# Patient Record
Sex: Female | Born: 1980 | Race: White | Hispanic: No | Marital: Married | Smoking: Current every day smoker
Health system: Southern US, Community
[De-identification: ages and names within clinical notes are randomized; demographics above are authoritative.]

## PROBLEM LIST (undated history)

## (undated) DIAGNOSIS — F319 Bipolar disorder, unspecified: Secondary | ICD-10-CM

## (undated) DIAGNOSIS — F32A Depression, unspecified: Secondary | ICD-10-CM

## (undated) DIAGNOSIS — K219 Gastro-esophageal reflux disease without esophagitis: Secondary | ICD-10-CM

## (undated) DIAGNOSIS — I471 Supraventricular tachycardia, unspecified: Secondary | ICD-10-CM

## (undated) DIAGNOSIS — R002 Palpitations: Secondary | ICD-10-CM

## (undated) DIAGNOSIS — G43909 Migraine, unspecified, not intractable, without status migrainosus: Secondary | ICD-10-CM

## (undated) DIAGNOSIS — F329 Major depressive disorder, single episode, unspecified: Secondary | ICD-10-CM

## (undated) HISTORY — DX: Gastro-esophageal reflux disease without esophagitis: K21.9

## (undated) HISTORY — DX: Palpitations: R00.2

## (undated) HISTORY — PX: CHOLECYSTECTOMY: SHX55

## (undated) HISTORY — PX: ABDOMINAL HYSTERECTOMY: SHX81

## (undated) HISTORY — PX: TUBAL LIGATION: SHX77

---

## 2000-06-23 ENCOUNTER — Emergency Department (HOSPITAL_COMMUNITY): Admission: EM | Admit: 2000-06-23 | Discharge: 2000-06-24 | Payer: Self-pay | Admitting: Neurology

## 2001-09-16 ENCOUNTER — Inpatient Hospital Stay (HOSPITAL_COMMUNITY): Admission: EM | Admit: 2001-09-16 | Discharge: 2001-09-22 | Payer: Self-pay | Admitting: Psychiatry

## 2001-11-04 ENCOUNTER — Inpatient Hospital Stay (HOSPITAL_COMMUNITY): Admission: EM | Admit: 2001-11-04 | Discharge: 2001-11-12 | Payer: Self-pay | Admitting: Psychiatry

## 2004-10-10 ENCOUNTER — Ambulatory Visit: Payer: Self-pay | Admitting: Cardiology

## 2005-06-18 ENCOUNTER — Ambulatory Visit: Payer: Self-pay | Admitting: Cardiology

## 2005-12-24 ENCOUNTER — Ambulatory Visit: Payer: Self-pay | Admitting: Cardiology

## 2007-02-07 ENCOUNTER — Ambulatory Visit (HOSPITAL_COMMUNITY): Admission: RE | Admit: 2007-02-07 | Discharge: 2007-02-07 | Payer: Self-pay | Admitting: Obstetrics and Gynecology

## 2008-06-27 ENCOUNTER — Encounter: Payer: Self-pay | Admitting: Cardiology

## 2008-06-29 ENCOUNTER — Emergency Department (HOSPITAL_COMMUNITY): Admission: EM | Admit: 2008-06-29 | Discharge: 2008-06-29 | Payer: Self-pay | Admitting: Emergency Medicine

## 2008-06-30 ENCOUNTER — Ambulatory Visit (HOSPITAL_COMMUNITY): Admission: RE | Admit: 2008-06-30 | Discharge: 2008-06-30 | Payer: Self-pay | Admitting: Emergency Medicine

## 2008-07-05 ENCOUNTER — Ambulatory Visit: Payer: Self-pay | Admitting: Cardiology

## 2008-07-14 ENCOUNTER — Observation Stay (HOSPITAL_COMMUNITY): Admission: RE | Admit: 2008-07-14 | Discharge: 2008-07-15 | Payer: Self-pay | Admitting: General Surgery

## 2008-07-14 ENCOUNTER — Encounter (INDEPENDENT_AMBULATORY_CARE_PROVIDER_SITE_OTHER): Payer: Self-pay | Admitting: General Surgery

## 2008-12-31 IMAGING — US US OB DETAIL+14 WK
1 series · 14 of 28 positions shown · non-contrast
Comparison: none

OBSTETRICAL ULTRASOUND:
 This ultrasound was performed in The [HOSPITAL], and the [facility] OB/[facility] report will be stored to [REDACTED] PACS.

[Series 1: us ob detail+14 wk · 14 of 90 slices shown]
[im 4/90]
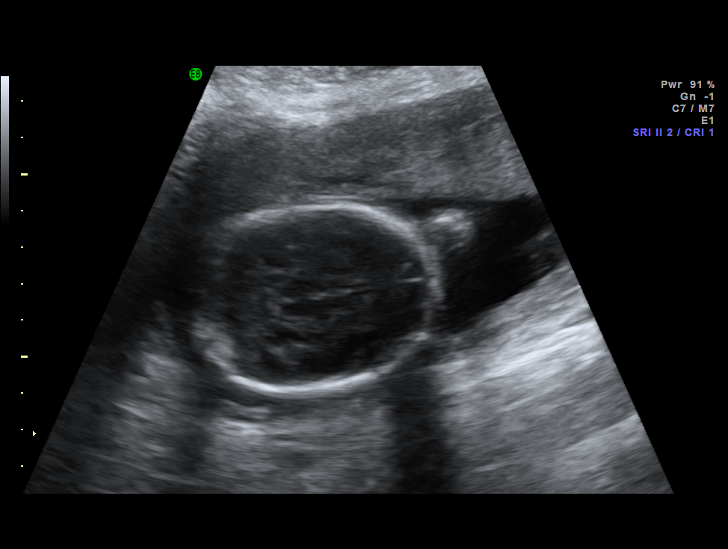
[im 10/90]
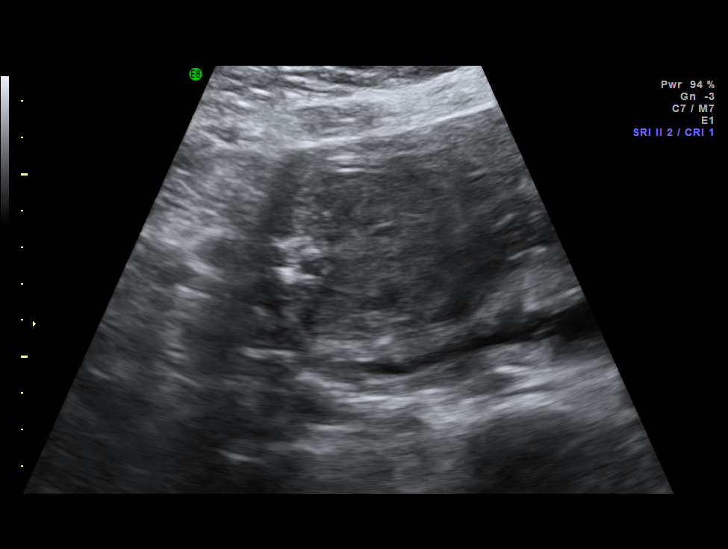
[im 17/90]
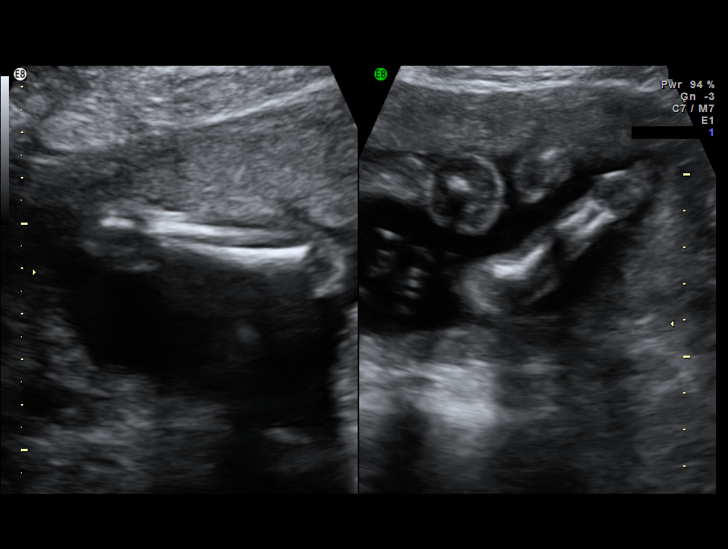
[im 24/90]
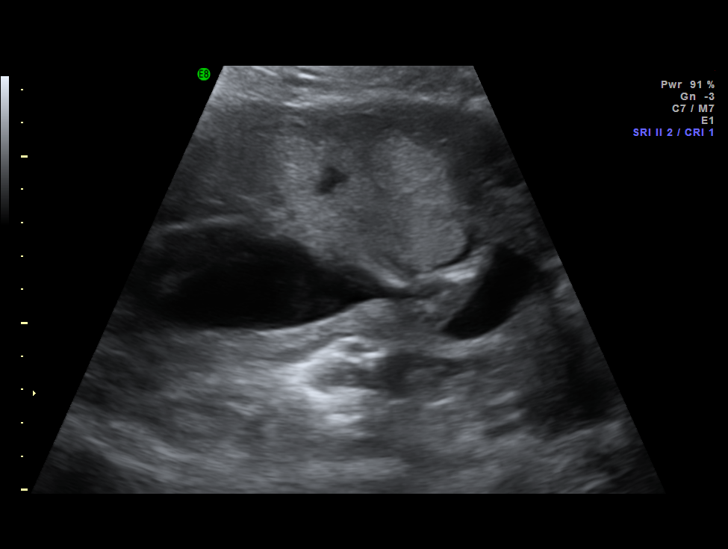
[im 30/90]
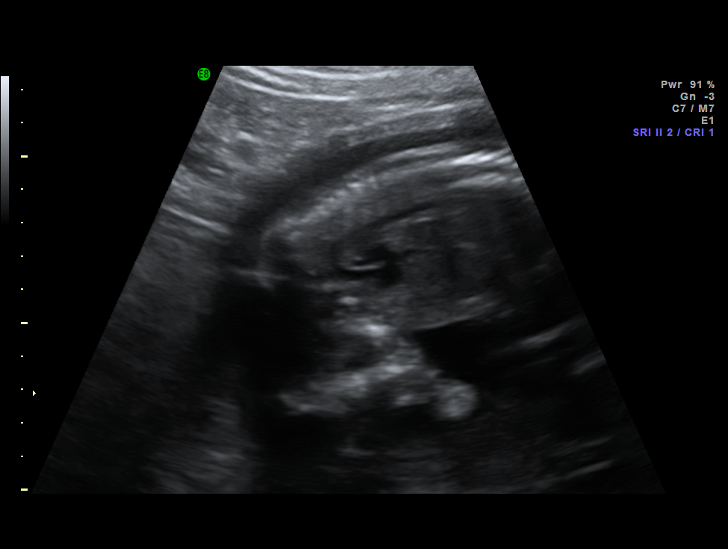
[im 37/90]
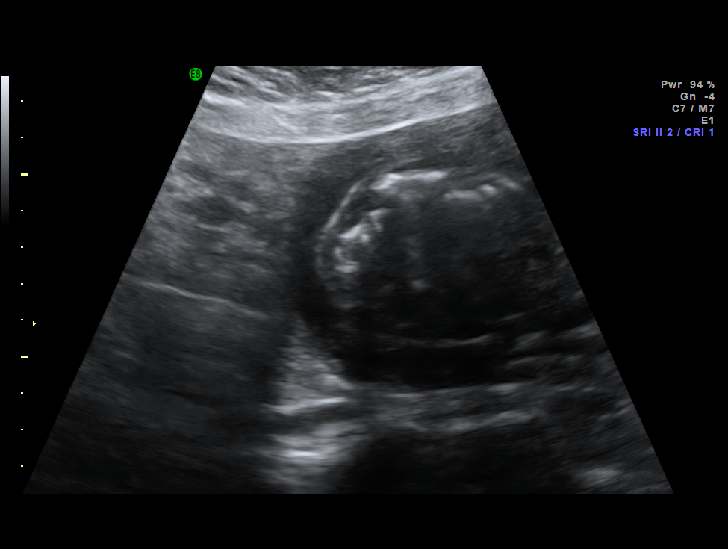
[im 43/90]
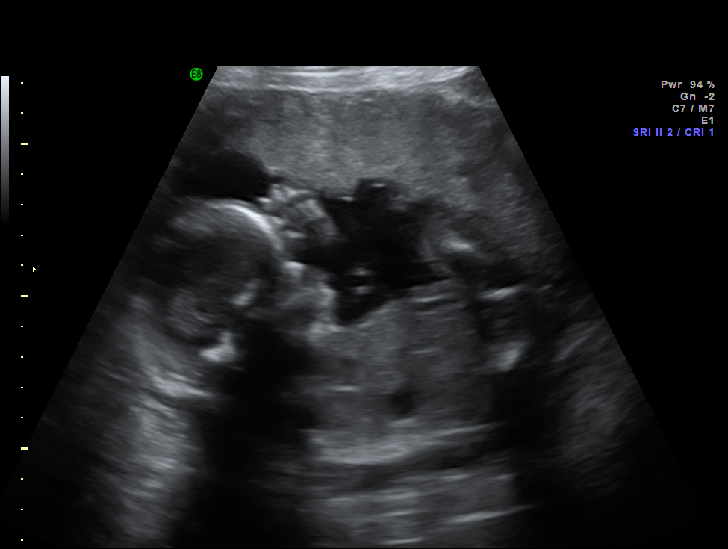
[im 50/90]
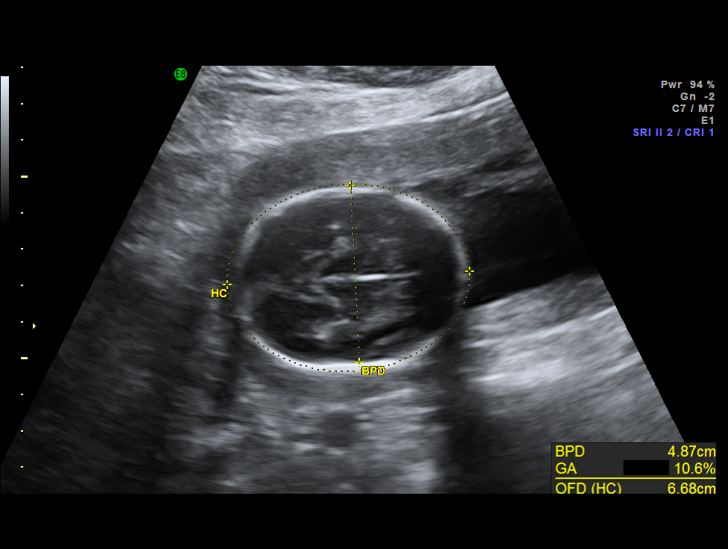
[im 57/90]
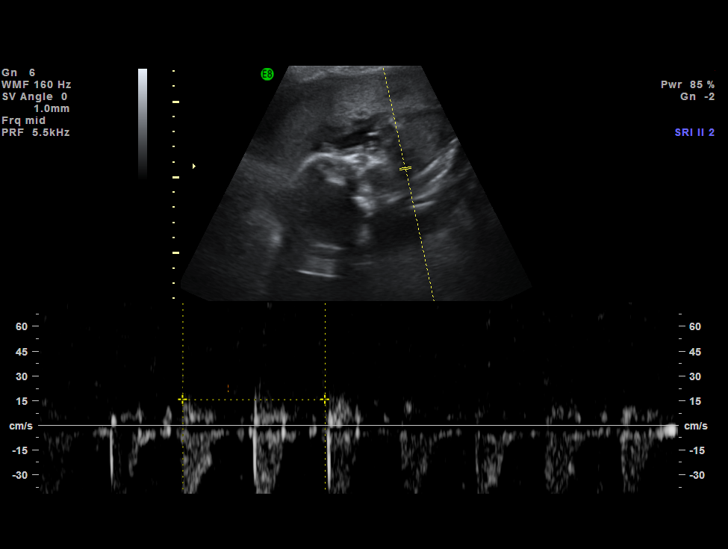
[im 63/90]
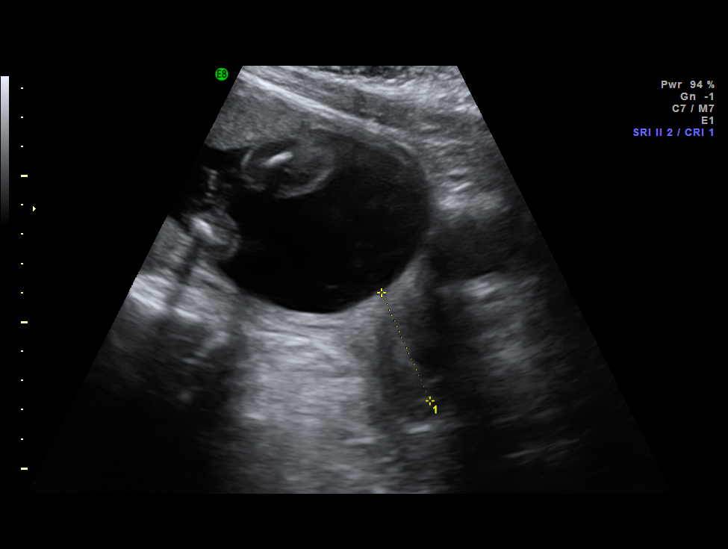
[im 70/90]
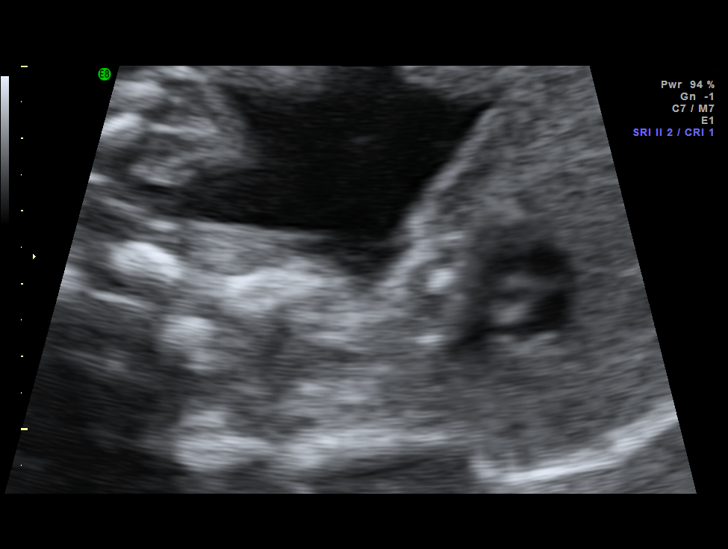
[im 76/90]
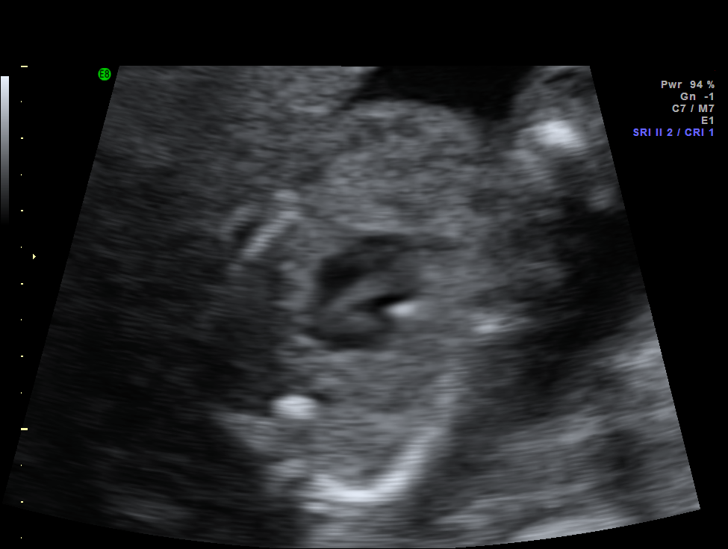
[im 83/90]
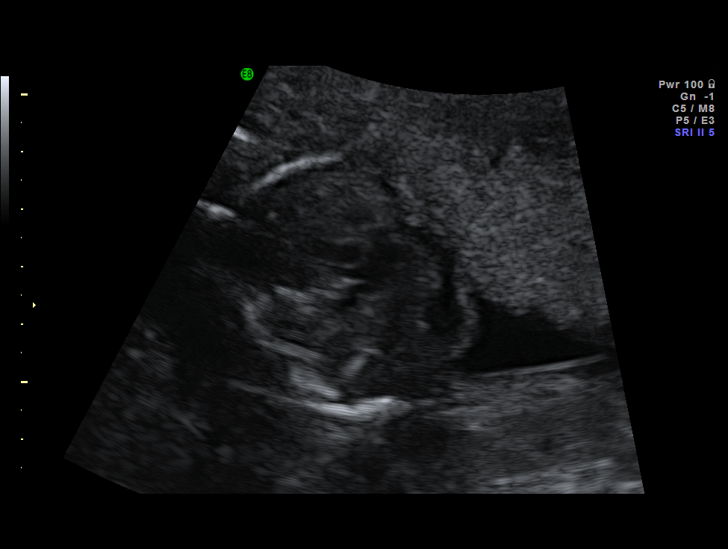
[im 90/90]
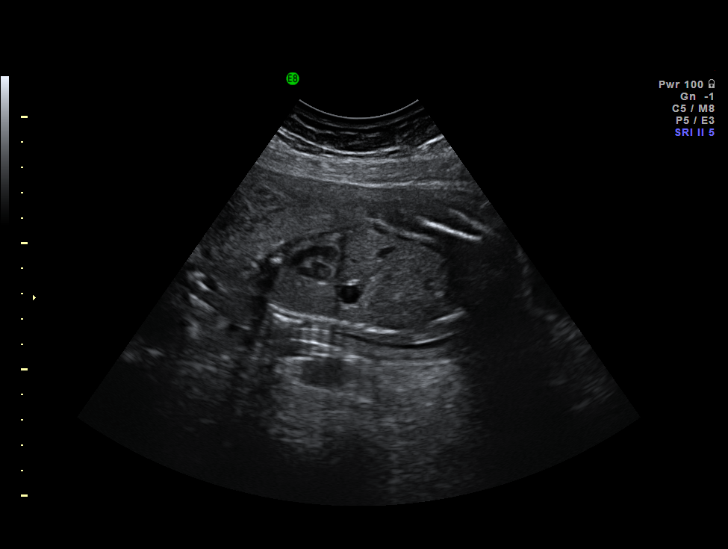

[14 of 28 positions shown; findings below may reference images not displayed]

IMPRESSION: The [facility] OB/GY[facility] report has also been faxed to the ordering physician.

## 2009-06-19 ENCOUNTER — Emergency Department (HOSPITAL_COMMUNITY): Admission: EM | Admit: 2009-06-19 | Discharge: 2009-06-20 | Payer: Self-pay | Admitting: Emergency Medicine

## 2009-08-23 ENCOUNTER — Encounter (INDEPENDENT_AMBULATORY_CARE_PROVIDER_SITE_OTHER): Payer: Self-pay | Admitting: *Deleted

## 2009-11-30 ENCOUNTER — Encounter (INDEPENDENT_AMBULATORY_CARE_PROVIDER_SITE_OTHER): Payer: Self-pay | Admitting: *Deleted

## 2010-03-28 NOTE — Letter (Signed)
Summary: Appointment - Reminder 2  [facility] [place]. [address] [place] [address]   Phone: [phone]  Fax: [phone]     November 30, 2009 MRN: [id number]   [facility] [address] [place], [place]  [address]   Dear Ms. Grattan,  Our records indicate that it is time to schedule a follow-up appointment.  Dr.   Dietrich Pates       recommended that you follow up with [place] in   06/2009         . It is very important that we reach you to schedule this appointment. We look forward to participating in your health care needs. Please contact us at the number listed above at your earliest convenience to schedule your appointment.  If you are unable to make an appointment at this time, give [place] a call so we can update our records.     Sincerely,   Glass blower/designer

## 2010-03-28 NOTE — Letter (Signed)
Summary: Appointment - Reminder 2  [facility] [place]. [address] [place] [address]   Phone: [phone]  Fax: [phone]     August 23, 2009 MRN: [id number]   [facility] [address] [place], [place]  [address]   Dear Gwendolyn [name],  Our records indicate that it is time to schedule a follow-up appointment.  Dr. Dietrich Pates         recommended that you follow up with [place] in  MAY 2011          . It is very important that we reach you to schedule this appointment. We look forward to participating in your health care needs. Please contact us at the number listed above at your earliest convenience to schedule your appointment.  If you are unable to make an appointment at this time, give [place] a call so we can update our records.     Sincerely,   Glass blower/designer

## 2010-05-07 ENCOUNTER — Emergency Department (HOSPITAL_COMMUNITY)
Admission: EM | Admit: 2010-05-07 | Discharge: 2010-05-07 | Disposition: A | Discharge status: Home / Self Care | Payer: Medicaid Other | Attending: Emergency Medicine | Admitting: Emergency Medicine

## 2010-05-07 DIAGNOSIS — K219 Gastro-esophageal reflux disease without esophagitis: Secondary | ICD-10-CM | POA: Insufficient documentation

## 2010-05-07 DIAGNOSIS — F319 Bipolar disorder, unspecified: Secondary | ICD-10-CM | POA: Insufficient documentation

## 2010-05-07 DIAGNOSIS — K5289 Other specified noninfective gastroenteritis and colitis: Secondary | ICD-10-CM | POA: Insufficient documentation

## 2010-05-07 LAB — DIFFERENTIAL
Basophils Absolute: 0 10*3/uL (ref 0.0–0.1)
Eosinophils Absolute: 0.1 10*3/uL (ref 0.0–0.7)
Lymphocytes Relative: 26 % (ref 12–46)
Lymphs Abs: 1.6 10*3/uL (ref 0.7–4.0)
Monocytes Relative: 9 % (ref 3–12)

## 2010-05-07 LAB — CBC
HCT: 31.7 % — ABNORMAL LOW (ref 36.0–46.0)
Platelets: 219 10*3/uL (ref 150–400)
RBC: 4.35 MIL/uL (ref 3.87–5.11)
WBC: 6.1 10*3/uL (ref 4.0–10.5)

## 2010-05-07 LAB — URINALYSIS, ROUTINE W REFLEX MICROSCOPIC
Hgb urine dipstick: NEGATIVE
Ketones, ur: NEGATIVE mg/dL
pH: 6.5 (ref 5.0–8.0)

## 2010-05-07 LAB — PREGNANCY, URINE: Preg Test, Ur: NEGATIVE

## 2010-05-07 LAB — BASIC METABOLIC PANEL
Chloride: 104 mEq/L (ref 96–112)
Creatinine, Ser: 0.71 mg/dL (ref 0.4–1.2)
GFR calc Af Amer: >60 mL/min (ref >60)
Glucose, Bld: 94 mg/dL (ref 70–99)

## 2010-05-16 LAB — URINALYSIS, ROUTINE W REFLEX MICROSCOPIC
Bilirubin Urine: NEGATIVE
Leukocytes, UA: NEGATIVE
Nitrite: NEGATIVE
Specific Gravity, Urine: 1.015 (ref 1.005–1.030)
Urobilinogen, UA: 0.2 mg/dL (ref 0.0–1.0)
pH: 6 (ref 5.0–8.0)

## 2010-05-16 LAB — POCT I-STAT, CHEM 8
Calcium, Ion: 1.15 mmol/L (ref 1.12–1.32)
Chloride: 102 mEq/L (ref 96–112)
HCT: 42 % (ref 36.0–46.0)
Hemoglobin: 14.3 g/dL (ref 12.0–15.0)
Potassium: 3.8 mEq/L (ref 3.5–5.1)

## 2010-05-16 LAB — URINE MICROSCOPIC-ADD ON

## 2010-05-24 IMAGING — US US ABDOMEN COMPLETE
1 series · 14 of 25 positions shown · non-contrast
Comparison: None

CLINICAL DATA: Right upper quadrant pain.

COMPLETE ABDOMINAL ULTRASOUND

[Series 1: unknown · 0.28mm/px · 14 of 69 slices shown]
[im 1/69]
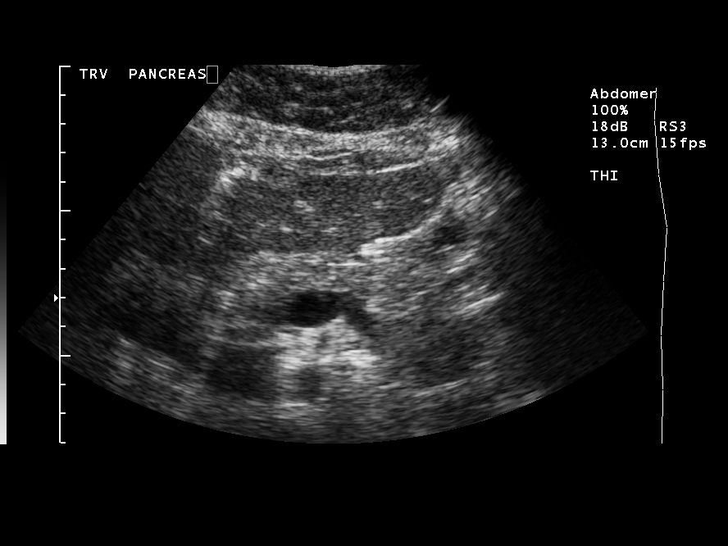
[im 6/69]
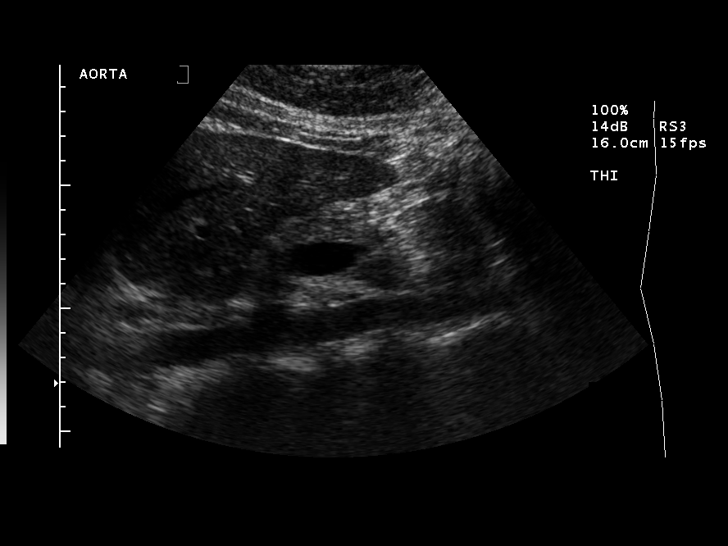
[im 12/69]
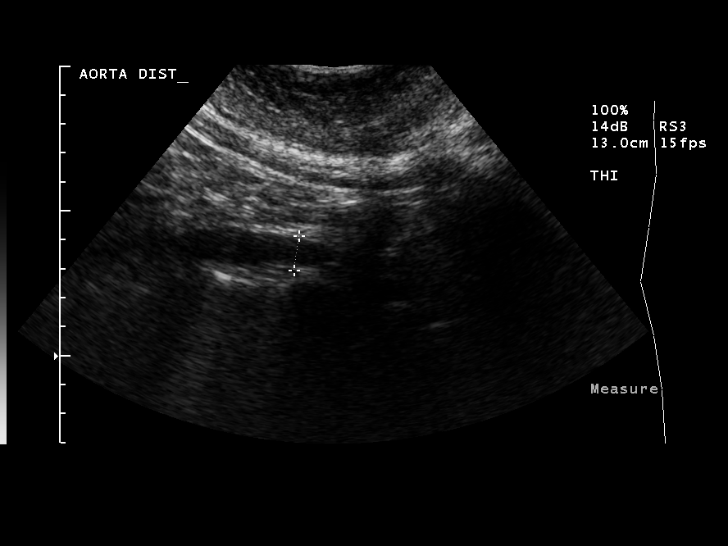
[im 18/69]
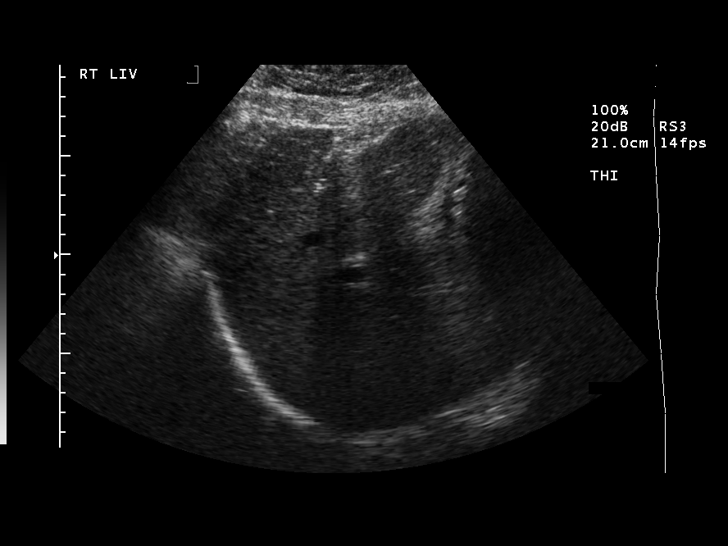
[im 23/69]
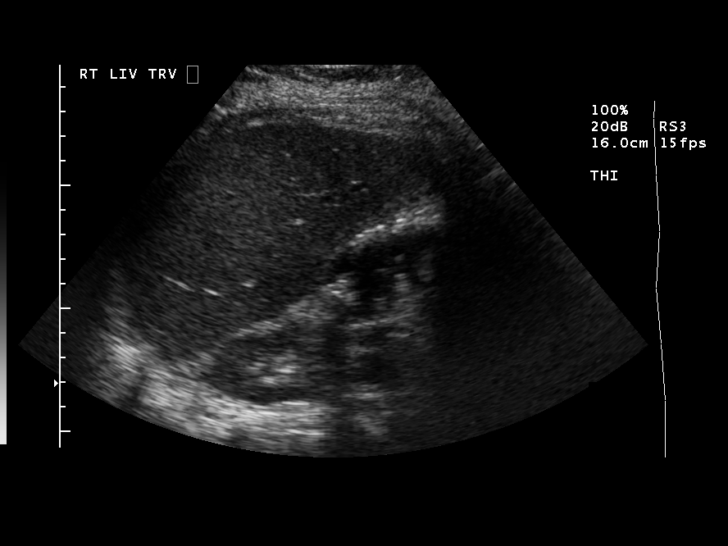
[im 26/69]
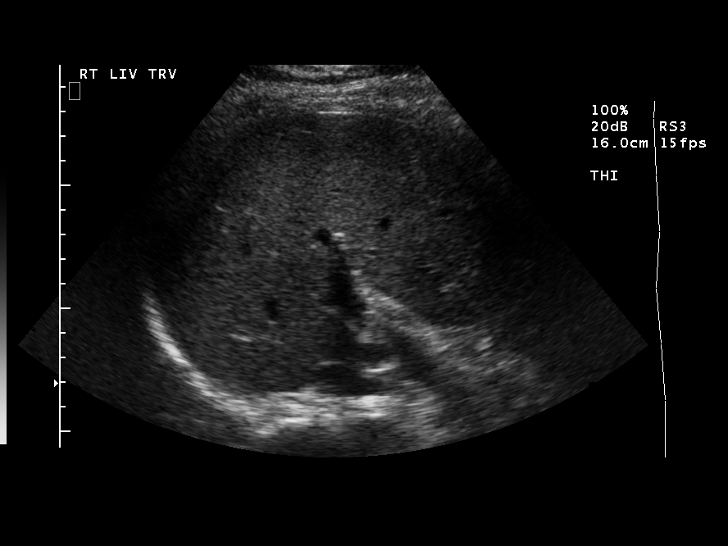
[im 32/69]
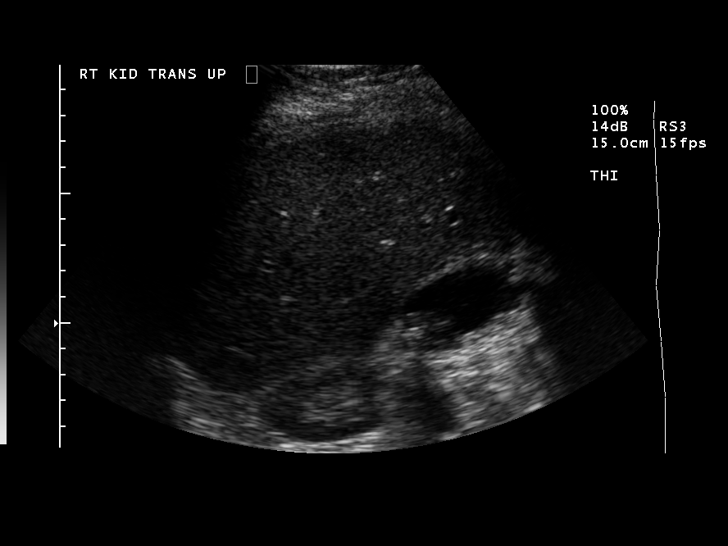
[im 37/69]
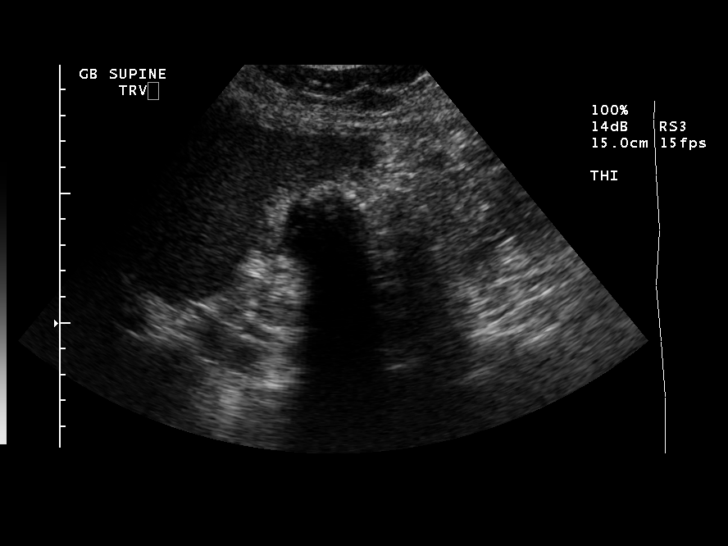
[im 43/69]
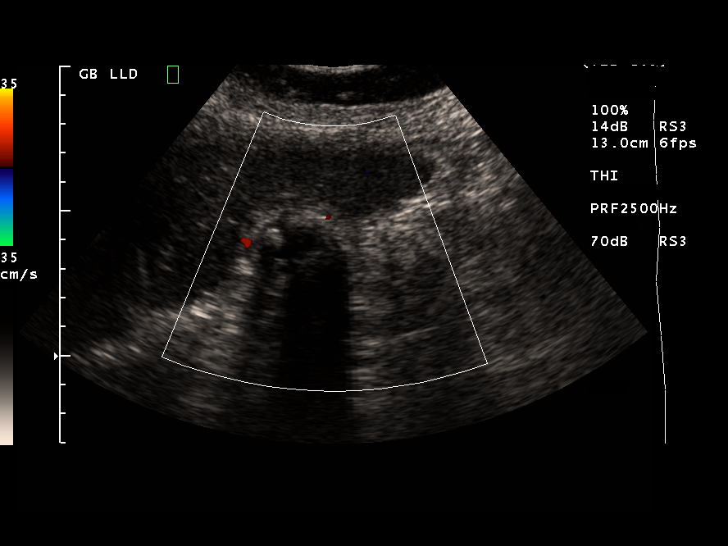
[im 46/69]
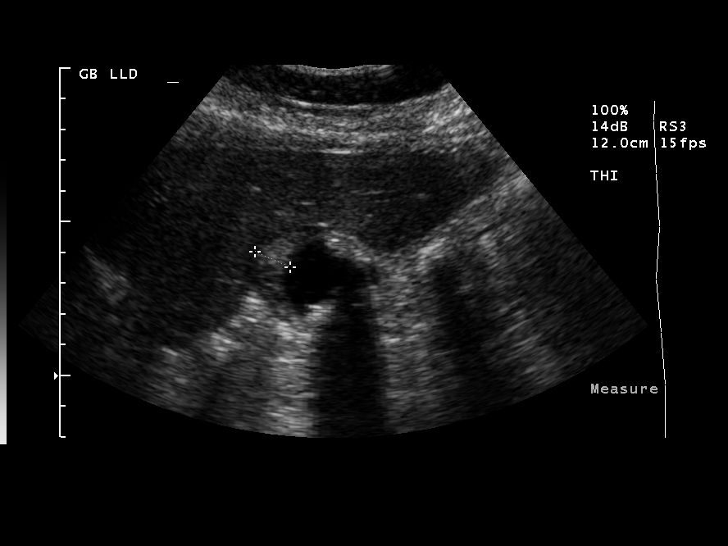
[im 52/69]
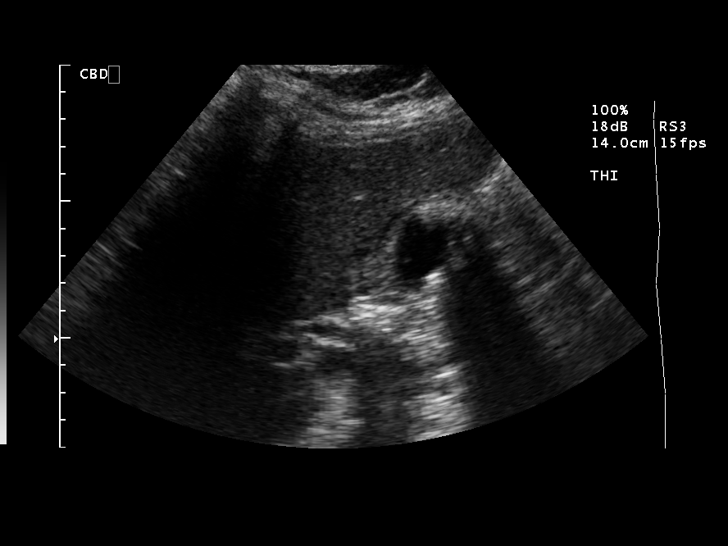
[im 57/69]
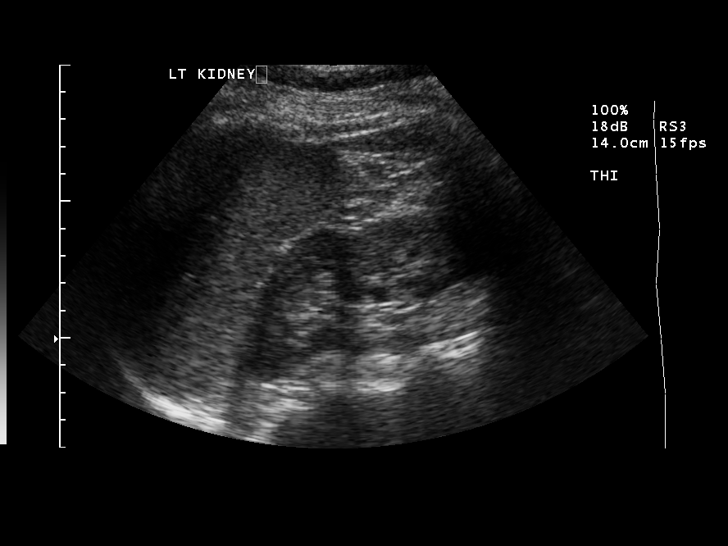
[im 63/69]
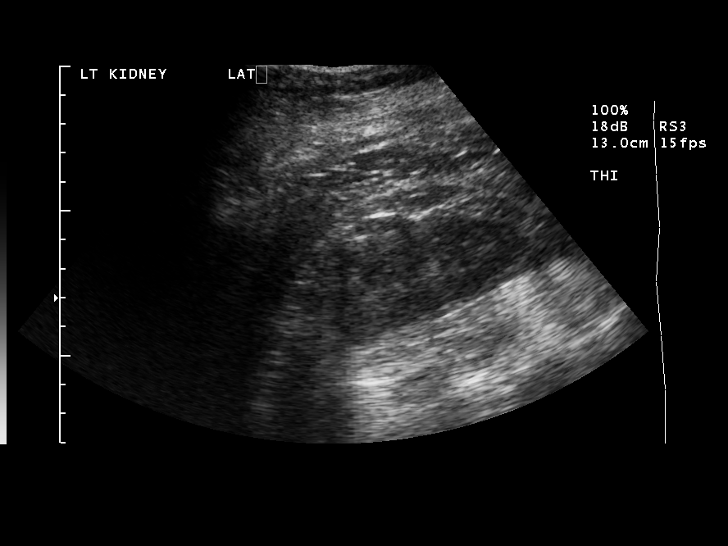
[im 69/69]
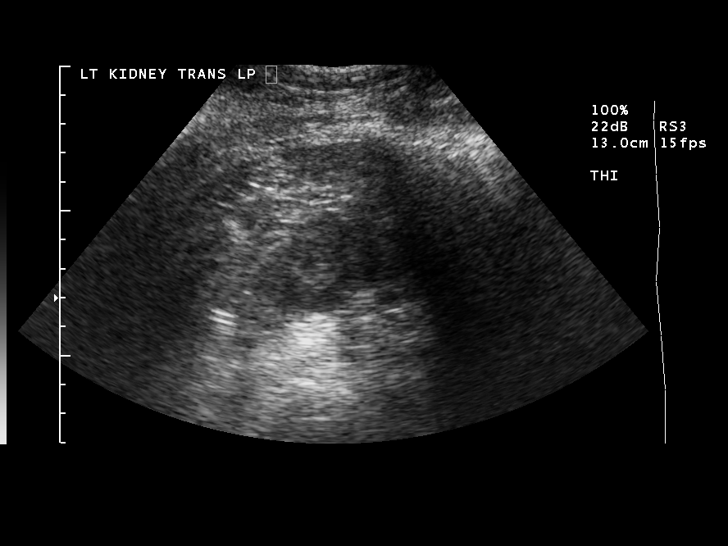

[14 of 25 positions shown; findings below may reference images not displayed]

FINDINGS: Gallbladder:  Multiple gallstones are identified in general
measuring under 12 mm in diameter.  Gallbladder wall thickening
noted, measuring up to 12 mm, with shadowing from the wall of the
fundus of the gallbladder favoring gas in the gallbladder wall over
adherent gallstones. Sonographic Murphy's sign is absent.

Common bile duct:  Up to 7 mm in diameter, abnormal.  No directly
visualized choledocholithiasis is identified.

Liver:  Unremarkable.

IVC:  Unremarkable.

Pancreas:  Unremarkable.

Spleen:  Unremarkable.

Right Kidney:  Unremarkable, 10.1 cm in length.

Left Kidney:  Unremarkable, 10.3 cm in length.

Abdominal aorta:  Unremarkable.

Other Findings:  None.
IMPRESSION: 1.  Gallstones with gallbladder wall thickening and shadowing from
the gallbladder wall suspicious for emphysematous cholecystitis.
2.  Dilated common bile duct, although no stone in the CBD is
directly visualized.

## 2010-06-06 LAB — COMPREHENSIVE METABOLIC PANEL
ALT: 147 U/L — ABNORMAL HIGH (ref 0–35)
AST: 69 U/L — ABNORMAL HIGH (ref 0–37)
Albumin: 3.4 g/dL — ABNORMAL LOW (ref 3.5–5.2)
Albumin: 3.7 g/dL (ref 3.5–5.2)
Alkaline Phosphatase: 95 U/L (ref 39–117)
BUN: 5 mg/dL — ABNORMAL LOW (ref 6–23)
BUN: 6 mg/dL (ref 6–23)
Calcium: 9.3 mg/dL (ref 8.4–10.5)
Creatinine, Ser: 0.86 mg/dL (ref 0.4–1.2)
Creatinine, Ser: 0.96 mg/dL (ref 0.4–1.2)
GFR calc Af Amer: >60 mL/min (ref >60)
Glucose, Bld: 109 mg/dL — ABNORMAL HIGH (ref 70–99)
Potassium: 3.7 mEq/L (ref 3.5–5.1)
Potassium: 3.7 mEq/L (ref 3.5–5.1)
Total Bilirubin: 1 mg/dL (ref 0.3–1.2)
Total Protein: 6.3 g/dL (ref 6.0–8.3)
Total Protein: 6.8 g/dL (ref 6.0–8.3)

## 2010-06-06 LAB — DIFFERENTIAL
Basophils Absolute: 0 10*3/uL (ref 0.0–0.1)
Basophils Absolute: 0 10*3/uL (ref 0.0–0.1)
Basophils Relative: 0 % (ref 0–1)
Basophils Relative: 1 % (ref 0–1)
Eosinophils Absolute: 0.1 10*3/uL (ref 0.0–0.7)
Eosinophils Relative: 0 % (ref 0–5)
Lymphocytes Relative: 32 % (ref 12–46)
Lymphocytes Relative: 15 % (ref 12–46)
Lymphs Abs: 1.1 10*3/uL (ref 0.7–4.0)
Lymphs Abs: 1.4 10*3/uL (ref 0.7–4.0)
Lymphs Abs: 1.4 10*3/uL (ref 0.7–4.0)
Monocytes Absolute: 0.2 10*3/uL (ref 0.1–1.0)
Monocytes Absolute: 0.4 10*3/uL (ref 0.1–1.0)
Monocytes Absolute: 0.4 10*3/uL (ref 0.1–1.0)
Monocytes Relative: 4 % (ref 3–12)
Monocytes Relative: 5 % (ref 3–12)
Neutro Abs: 2.7 10*3/uL (ref 1.7–7.7)
Neutro Abs: 7 10*3/uL (ref 1.7–7.7)
Neutrophils Relative %: 60 % (ref 43–77)
Neutrophils Relative %: 84 % — ABNORMAL HIGH (ref 43–77)
Neutrophils Relative %: 79 % — ABNORMAL HIGH (ref 43–77)

## 2010-06-06 LAB — HEPATIC FUNCTION PANEL
ALT: 259 U/L — ABNORMAL HIGH (ref 0–35)
Albumin: 3.3 g/dL — ABNORMAL LOW (ref 3.5–5.2)
Alkaline Phosphatase: 235 U/L — ABNORMAL HIGH (ref 39–117)
Alkaline Phosphatase: 363 U/L — ABNORMAL HIGH (ref 39–117)
Indirect Bilirubin: 0.7 mg/dL (ref 0.3–0.9)
Total Bilirubin: 1.1 mg/dL (ref 0.3–1.2)
Total Bilirubin: 1.1 mg/dL (ref 0.3–1.2)
Total Protein: 6.4 g/dL (ref 6.0–8.3)
Total Protein: 6.6 g/dL (ref 6.0–8.3)

## 2010-06-06 LAB — CBC
HCT: 34.3 % — ABNORMAL LOW (ref 36.0–46.0)
HCT: 34.7 % — ABNORMAL LOW (ref 36.0–46.0)
HCT: 38.2 % (ref 36.0–46.0)
Hemoglobin: 11.7 g/dL — ABNORMAL LOW (ref 12.0–15.0)
Hemoglobin: 11.9 g/dL — ABNORMAL LOW (ref 12.0–15.0)
Hemoglobin: 12.7 g/dL (ref 12.0–15.0)
MCHC: 33.2 g/dL (ref 30.0–36.0)
Platelets: 224 10*3/uL (ref 150–400)
Platelets: 227 10*3/uL (ref 150–400)
Platelets: 241 10*3/uL (ref 150–400)
RDW: 14.9 % (ref 11.5–15.5)

## 2010-06-06 LAB — BASIC METABOLIC PANEL
Calcium: 9.2 mg/dL (ref 8.4–10.5)
GFR calc non Af Amer: >60 mL/min (ref >60)
Potassium: 3.9 mEq/L (ref 3.5–5.1)
Sodium: 139 mEq/L (ref 135–145)

## 2010-06-06 LAB — AMYLASE: Amylase: 115 U/L (ref 27–131)

## 2010-06-06 LAB — HCG, QUANTITATIVE, PREGNANCY: hCG, Beta Chain, Quant, S: <2 m[IU]/mL (ref <5)

## 2010-06-06 LAB — RAPID URINE DRUG SCREEN, HOSP PERFORMED
Amphetamines: NOT DETECTED
Barbiturates: NOT DETECTED
Cocaine: NOT DETECTED
Tetrahydrocannabinol: POSITIVE — AB

## 2010-07-11 NOTE — Op Note (Signed)
NAMEEARLE, SIERZEGA NO.:  [id number]   MEDICAL RECORD NO.:  [id number]          PATIENT TYPE:  OIB   LOCATION:  A311                          FACILITY:  [facility]   PHYSICIAN:  [name], M.D. DATE OF BIRTH:  [birth year]   DATE OF PROCEDURE:  07/14/2008  DATE OF DISCHARGE:                               OPERATIVE REPORT   SURGEON:  [name], MD   PREOPERATIVE DIAGNOSES:  Cholecystitis, cholelithiasis   POSTOPERATIVE DIAGNOSES:  Cholecystitis, cholelithiasis   PROCEDURE:  Laparoscopic cholecystectomy.   SPECIMEN:  Gallbladder with stones.   WOUND CLASSIFICATION:  Contaminated.   NOTE:  This is a 30 year old white female who had 1 episode of right  upper quadrant pain with nausea and vomiting.  She was noted to have  multiple stones within her gallbladder on sonogram.  She was advised to  have a restrictive diet and planned for elective surgery.  We discussed  complications not limited to, but including bleeding, infection, damage  to bile ducts, perforation of organs and transitory diarrhea.  Informed  consent was obtained.  We also obtained Cardiology clearance as she had  had a history of PAT and SVT.   GROSS OPERATIVE FINDINGS:  The patient had an elongated thickened  gallbladder with literally 100 stones within it.  Small cystic duct  which was not cannulated for cholangiogram.  The right upper quadrant  otherwise appeared to be normal.   TECHNIQUE:  The patient was placed in a supine position and after the  adequate administration of general anesthesia via endotracheal  intubation, a Foley catheter was aseptically inserted.  Her abdomen was  prepped with Betadine solution and she was draped in the usual manner.  A periumbilical incision was carried out over the superior aspect of the  umbilicus.  The fascia was revealed and grasped with a sharp towel clip  and elevated, and a Veress needle was inserted and confirmed the  position with a  saline drop test.  The abdomen was then insufflated with  approximately 3.8 L of CO2.  We then placed the 11-mm cannula in the  umbilicus incision using the Visiport technique and then under direct  vision, an 11-mm cannula was placed in the epigastrium and two 5-mm  cannulas in the right upper quadrant laterally.  The gallbladder was  grasped, its adhesions were taken down.  The cystic duct was visualized  and clamped triply and divided as was the cystic artery.  The  gallbladder was then removed uneventfully from the liver bed using the  hook cautery device.  There was a spillage of couple stones which were  easily retrieved.  There was no spillage of any bile.  This was because  it was a short cystic duct and I wanted to make sure that it was well  clipped and there was less clipped near the gallbladder.  The  gallbladder was then retrieved with the Endosac device without any  problems and then removed through the epigastric incision.  We then  closed the epigastric incision with 0 Polysorb suture and used 0.5%  Sensorcaine at all  port sites for comfort.  Prior to closure, we  irrigated and checked for hemostasis.  I had elected to leave a Surgicel  within the liver bed and also a Jackson-Pratt drain to exit from one of  the lateral 5-mm cannula sites.  Prior to closure, all sponge, needle,  and instrument counts found to be correct.  Estimated blood loss was  minimal.  She received a liter of crystalloids intraoperatively.  The  drain was sutured in place with 3-0 nylon, and there were no  complications.   I will follow this patient perioperatively which she is to return to Dr.  Clelia Croft in [place] for any medical problems she will may have in the future.      [name], M.D.  Electronically Signed     WB/MEDQ  D:  07/14/2008  T:  07/15/2008  Job:  [id number]   cc:   [facility]   Dr. Clelia Croft

## 2010-07-11 NOTE — Letter (Signed)
Jul 05, 2008    Gwendolyn Barthel, MD  Gwendolyn Gwendolyn [name] [address]  [name], [place] [address]   RE:  Gwendolyn, Gwendolyn [name]  MRN:  [id number]  /  DOB:  [birth year]   Dear Gwendolyn Gwendolyn [name],   It was my pleasure evaluating Gwendolyn Gwendolyn [name] in consultation in the office  today at your request for a history of paroxysmal supraventricular  tachycardia.  As you know, Gwendolyn Gwendolyn [name] has summoned the EMS on a  number of occasions, but not within the past 2 years, for tachy  palpitations.  She reports that her heart rate was extremely high.  She  was given medication in the field with conversion to sinus rhythm.  The  most recent episode occurred in late 2008.  We have requested records,  which are pending.  Gwendolyn Gwendolyn [name] has not had chest pain, dyspnea,  lightheadedness, nor syncope with her SVT.  The possibility of  radiofrequency ablation was discussed with her, but was not thought to  be indicated due to the infrequency of her spells.  She previously was  treated with as much as 200 mg per day of long-acting metoprolol, but  Gwendolyn was cut to a total of 150 mg in divided doses due to  lightheadedness and the fatigue on a higher dose.   Cardiology consultation is obtained in anticipation of laparoscopic  cholecystectomy for an episode of abdominal pain requiring treatment in  the emergency department with associated cholelithiasis.   PAST MEDICAL HISTORY:  Notable for GERD, bipolar disorder, depression,  and genital herpes.  She has had migraine headaches in the past and been  diagnosed with narcolepsy.   SOCIAL HISTORY:  Two young children; previously smoked cigarettes, but  not for the past few years; no use of alcohol, but does use cannabis.  She has two children under the age of 2, which provide her, her daily  physical activity.  She has never previously undergone surgery.  She has  only been hospitalized for normal spontaneous vaginal deliveries.   ALLERGIES:  She reports an allergy to PENICILLIN and DOXYCYCLINE.   CURRENT  MEDICATIONS:  1. Prilosec 20 mg daily.  2. Metoprolol 100 mg q.a.m. and 50 mg q.p.m.  3. Diazepam 5 mg b.i.d.  4. Iron supplement.  5. Zoloft 100 mg daily.  6. Lamictal 100 mg daily.  7. Valtrex 500 mg daily.  8. Depo Provera.   FAMILY HISTORY:  Father is alive, but has a history of cardiac disease.  Mother is alive and well.  She has 2 brothers, both of whom have a  cardiac history.   REVIEW OF SYSTEMS:  Notable for intermittent headaches, the need for  corrective lenses, rare episodes of nausea and heartburn.  All other  systems reviewed and are negative.   PHYSICAL EXAMINATION:  GENERAL:  Pleasant overweight Gwendolyn [name] in no acute  distress.  VITAL SIGNS:  The weight is 203, blood pressure 110/70, heart rate 55  and regular, respirations 14 and unlabored.  HEENT:  EOMs full; normal oral mucosa; pierced tongue.  NECK:  No jugular venous distention; normal carotid upstrokes without  bruits.  ENDOCRINE:  No thyromegaly.  HEMATOPOIETIC:  No adenopathy.  LUNGS:  Clear.  SKIN:  No significant lesions.  CARDIAC:  Normal first and second heart sounds.  ABDOMEN:  Pierced umbilicus; soft and nontender; no organomegaly.  EXTREMITIES:  No edema; normal distal pulses.  NEUROLOGIC:  Symmetric strength and tone; normal cranial nerves.   EKG:  Normal sinus rhythm; shallow T-wave inversions in  lead V2 and V3  consistent with a persistent juvenile pattern.   Records and laboratories were reviewed from her treatment in the  emergency department.  I ultrasound, she had cholelithiasis and evidence  for cholecystitis.  Common bile duct was dilated, but no stone was  identified.  CBC was normal including a normal white count.  A chemistry  profile was normal except for borderline glucose of 109.  Lipase and  amylase were normal.   IMPRESSION:  Gwendolyn Gwendolyn [name] has very infrequent episodes of symptomatic  paroxysmal supraventricular tachycardia.  It may be that she could  reduce her dose of beta-blocker.   I will seek prior records before  making that recommendation.  For now, she will take metoprolol 100 mg,  diazepam 5 mg, and omeprazole 20 mg on the morning of surgery.  I will  be available to assist with her care as required.  I will plan to see  Gwendolyn Gwendolyn [name] again in the office in 1 year or should paroxysmal  supraventricular tachycardia recur.    Sincerely,      Gwendolyn Friends. Dietrich Pates, MD, [facility]  Electronically Signed    RMR/MedQ  DD: 07/05/2008  DT: 07/06/2008  Job #: [id number]   CC:   Kirstie Peri, MD  Jonelle Sidle, MD
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
## 2010-07-14 NOTE — Discharge Summary (Signed)
Gwendolyn [name], MKRTCHYAN NO.:  [id number]   MEDICAL RECORD NO.:  [id number]                  PATIENT TYPE:  IPS   LOCATION:  0502                                 FACILITY:  [facility]   PHYSICIAN:  Geoffery Lyons, MD                     DATE OF BIRTH:  March 28, 1980   DATE OF ADMISSION:  09/16/2001  DATE OF DISCHARGE:  09/22/2001                                 DISCHARGE SUMMARY   CHIEF COMPLAINT AND PRESENT ILLNESS:  This was the first admission to [facility] for this 30 year old white female voluntarily  admitted.  History of depression, crying, having suicidal thoughts, no  specific plan.  Financial concerns because of __________  husband who has  not worked for the past four years.  Sleep has been decreased, appetite has  been increased with a 20 pound weight gain.  Increased depression, feeling  very hopeless and helpless over the situation.  Positive visual  hallucinations.  No current suicidal or homicidal ideas, promised safety.   PAST PSYCHIATRIC HISTORY:  First time at [facility].  She goes  to [facility] on an outpatient basis.  One prior  suicide attempt when she cut her wrist at age 27.   SUBSTANCE ABUSE HISTORY:  Denied the use or abuse of any substances.   PAST MEDICAL HISTORY:  Paroxysmal atrial tachycardia.   MEDICATIONS:  1. Celexa 30 mg per day.  2. Valium 5 mg daily.  3. Toprol XL 25 mg daily.   PHYSICAL EXAMINATION:  Physical examination was performed, failed to show  any acute findings.   MENTAL STATUS EXAM:  Mental status exam revealed an alert, young,  overweight, cooperative female, casually dressed.  Speech was clear and  relevant.  Mood was depressed.  Affect was flat.  Thought processes were  coherent; no evidence of psychosis, no auditory and visual hallucinations,  no suicidal or homicidal ideas.  Cognitive: Cognition was well preserved.   ADMISSION  DIAGNOSES:   AXIS I:  Major depression, recurrent.   AXIS II:  No diagnosis.   AXIS III:  Paroxysmal atrial tachycardia.   AXIS IV:  Moderate.   AXIS V:  Global assessment of functioning upon admission 30, highest global  assessment of functioning in the last year 55.   LABORATORY DATA:  Other laboratory workup: CBC was within normal limits.  Blood chemistries were within normal limits.  Thyroid profile was within  normal limits.  Urine pregnancy was negative.  Drug screen was positive for  benzodiazepines.   HOSPITAL COURSE:  She was admitted and started in individual and group  psychotherapy.  She was kept on the Toprol.  Celexa was increased to 40.  She was kept on the Valium and the Ambien.  Due to depression, we increased  Celexa to 60 mg per day.  There was a  family session held with the husband  in which she was able to assert herself and tell her husband the need for  him to start finding a job.  The fact that she was able to confront the  husband be assertive made her feel better about herself.  On July 28, much  improved, no suicidal ideas, no homicidal ideas.  Upon discharge, she found  out that the husband had found a job and had decreased the amount of stress.  She was willing and motivated to pursue outpatient treatment; no suicidal or  homicidal ideation.   DISCHARGE DIAGNOSES:   AXIS I:  Major depression, recurrent.   AXIS II:  No diagnosis.   AXIS III:  Paroxysmal atrial tachycardia.   AXIS IV:  Moderate.   AXIS V:  Global assessment of functioning upon discharge 55.   DISCHARGE MEDICATIONS:  1. Toprol XL 25 mg daily.  2. Valium 5 mg at bedtime.  3. Celexa 60 mg per day.  4. Ambien 10 mg at bedtime for sleep.  5. Vistaril 25 mg one every six hours as needed for anxiety.   FOLLOW UP:  Follow-up at [facility].                                                 Geoffery Lyons, MD    IL/MEDQ  D:  10/22/2001  T:  10/23/2001   Job:  [id number]

## 2010-07-14 NOTE — Discharge Summary (Signed)
Gwendolyn [name], Gwendolyn [name] NO.:  [id number]   MEDICAL RECORD NO.:  [id number]                   PATIENT TYPE:  IPS   LOCATION:  0506                                 FACILITY:  [facility]   PHYSICIAN:  Carolanne Grumbling, M.D.                 DATE OF BIRTH:  1980/07/16   DATE OF ADMISSION:  11/04/2001  DATE OF DISCHARGE:  11/12/2001                                 DISCHARGE SUMMARY   INITIAL ASSESSMENT AND DIAGNOSIS:  The patient was admitted to the service  of Dr. Dub Mikes and was under his care until the last day of admission when I  happened to be on call.  She was admitted because of depression with  suicidal thoughts and she reportedly had some thoughts of hurting her  husband also.  She had no specific plans for either set of thoughts.  She  had felt depressed for the past 4 months.  She also reported some periods of  elation.  She had been recently discharged in July of this year.  She said  she coped for about 2 weeks and then got worse.  She reportedly was  experiencing auditory hallucinations telling her to hurt herself.  She also  admitted to a history of visual hallucinations.   MENTAL STATUS AT THE TIME OF THE INITIAL EVALUATION:  Was not recorded,  perhaps because of the recent admission.   PHYSICAL EXAMINATION:  Essentially within normal limits or noncontributory.   ADMITTING DIAGNOSIS:   AXIS I:  1. Major depressive disorder with suicidal ideation.  2. Rule out bipolar disorder.   AXIS II:  Deferred.   AXIS III:  1. Paroxysmal atrial tachycardia.  2. Supraventricular tachycardia.  3. Herpes.  4. Questionable generalized numbness.   AXIS IV:  Moderate.   AXIS V:  25/65.   FINDINGS:  All indicated laboratory examinations were within normal limits  or were noncontributory.   HOSPITAL COURSE:  While in the hospital, the patient basically complained of  headaches as her primary issue.  Her depression seemed to be aggravated by  situational  issues.  She and her husband had disagreements over taking care  of the children.  She reportedly at times would take them all and [place] which [place] her husband.  He also  was concerned because of her headaches and medication she was not properly  taking care of the kids.  She was concerned that he was overly disciplining  them.  She did have a session with her husband and her mother before she was  discharged.  At no point during the hospitalization did she complain of any  suicidal thoughts or threats towards anyone else, and consequently she was  discharged home.   MEDICATIONS AT THE TIME OF DISCHARGE:  1. Wellbutrin SR 200 mg daily.  2. Gabitril 4 mg tablets, taking 1/2 tablet per  day and 1 tablet at bedtime.  3. Toprol XL 50 mg daily.  4. Trazodone 100 mg tablets, taking 1/2 to 1 tablet at bedtime as needed.  5. Vistaril 50 mg tablets, taking 1/2 to 1 tablet every 6 hours as needed.  6. Midrin, taking 1 every 6 hours as needed for headaches and was prescribed     only 20 tablets.  7. Seroquel 20 mg tablets 4 times a day as needed.   POST HOSPITAL CARE PLANS:  She was to follow up with her family doctor for  pain medications, and she was cautioned regarding drowsiness because she is  taking trazodone, Vistaril, Midrin and Seroquel, all of which can cause  drowsiness.  She was to follow up at the [facility] with an appointment on November 18, 2001.   DIET AND ACTIVITY:  There were no restrictions placed on her diet or her  activity other than being cautious of the drowsiness.                                               Carolanne Grumbling, M.D.    GT/MEDQ  D:  11/24/2001  T:  11/24/2001  Job:  [id number]

## 2010-07-14 NOTE — Assessment & Plan Note (Signed)
[facility]                            [facility] [facility] OFFICE NOTE   NAME:Gwendolyn [name], Gwendolyn [name]                MRN:          [id number]  DATE:12/24/2005                            DOB:          1980/12/18    PRIMARY CARDIOLOGIST:  Jonelle Sidle, MD   REASON FOR OFFICE VISIT:  Scheduled six month follow-up.   Gwendolyn [name] is a 30 year old female, with a history of paroxysmal  supraventricular tachycardia, who now presents for scheduled follow-up.  When last seen here in the clinic in April, by Arnette Felts, [facility], she was  placed on an increased dose of Toprol to 200 mg q.d. However, she reports to  me today that this caused a drop in her pressure and thus necessitated a cut  back to the current 100 a.m./50 p.m. dose.  She feels much better in having  done so.   Regarding palpitations, she has had only about three episodes since last  seen in the clinic.  While these are of relatively brief duration, less than  a minute and not associated with any significant symptoms.  These are  essentially benign compared to what she has experienced in the past.   The patient quite smoking a month ago, refrains from all caffeinated  beverages but does report being under a bit of stress having to raise  three children.  Of note, she is on total disability.   CURRENT MEDICATIONS:  1. Toprol XL 100 every morning/50 every evening.  2. Wellbutrin XL 300 q.d.  3. Megace 40 mg q.d.  4. Buspar 15 every morning.   PHYSICAL EXAMINATION:  Blood pressure 114/76, pulse 64, regular.  Weight  198.  12-lead electrocardiogram today revealed normal sinus rhythm at 69  beats per minute with normal axis, nonspecific ST changes.  NECK:  Palpable carotid pulses without bruits.  LUNGS:  Clear to auscultation all fields.  HEART:  Regular rate and rhythm (S1, S2).  No significant murmurs.  ABDOMEN:  Protuberant nontender.  EXTREMITIES:  No pedal edema.   IMPRESSION:  1.  Paroxysmal supraventricular tachycardia.  Well-controlled on beta      blocker.  2. Normal left ventricular function.  Electrocardiogram 2000.  3. Bipolar disorder.  4. Obesity.  5. History of tobacco.   PLAN:  Continue current medication regimen.  Resume clinic follow-up in six  months with Jonelle Sidle, MD  Patient is to contact our office if she  has any increase in the frequency or severity of her tachy palpitations.     ______________________________  Rozell Searing, PA-C    ______________________________  Jonelle Sidle, MD   GS/MedQ  DD: 12/24/2005  DT: 12/25/2005  Job #: [id number]   cc:   Lia Hopping

## 2010-08-13 ENCOUNTER — Emergency Department (HOSPITAL_COMMUNITY)
Admission: EM | Admit: 2010-08-13 | Discharge: 2010-08-13 | Disposition: A | Discharge status: Home / Self Care | Payer: Medicaid Other | Attending: Emergency Medicine | Admitting: Emergency Medicine

## 2010-08-13 DIAGNOSIS — B009 Herpesviral infection, unspecified: Secondary | ICD-10-CM | POA: Insufficient documentation

## 2011-02-07 ENCOUNTER — Encounter: Payer: Self-pay | Admitting: Cardiology

## 2011-12-10 ENCOUNTER — Emergency Department (HOSPITAL_COMMUNITY)
Admission: EM | Admit: 2011-12-10 | Discharge: 2011-12-11 | Disposition: A | Discharge status: Home / Self Care | Payer: Medicaid Other | Attending: Emergency Medicine | Admitting: Emergency Medicine

## 2011-12-10 ENCOUNTER — Encounter (HOSPITAL_COMMUNITY): Payer: Self-pay

## 2011-12-10 DIAGNOSIS — N949 Unspecified condition associated with female genital organs and menstrual cycle: Secondary | ICD-10-CM | POA: Insufficient documentation

## 2011-12-10 DIAGNOSIS — F17200 Nicotine dependence, unspecified, uncomplicated: Secondary | ICD-10-CM | POA: Insufficient documentation

## 2011-12-10 DIAGNOSIS — G8929 Other chronic pain: Secondary | ICD-10-CM | POA: Insufficient documentation

## 2011-12-10 DIAGNOSIS — R102 Pelvic and perineal pain: Secondary | ICD-10-CM

## 2011-12-10 DIAGNOSIS — N898 Other specified noninflammatory disorders of vagina: Secondary | ICD-10-CM | POA: Insufficient documentation

## 2011-12-10 DIAGNOSIS — R109 Unspecified abdominal pain: Secondary | ICD-10-CM | POA: Insufficient documentation

## 2011-12-10 DIAGNOSIS — R197 Diarrhea, unspecified: Secondary | ICD-10-CM | POA: Insufficient documentation

## 2011-12-10 DIAGNOSIS — N946 Dysmenorrhea, unspecified: Secondary | ICD-10-CM | POA: Insufficient documentation

## 2011-12-10 DIAGNOSIS — F319 Bipolar disorder, unspecified: Secondary | ICD-10-CM | POA: Insufficient documentation

## 2011-12-10 DIAGNOSIS — K219 Gastro-esophageal reflux disease without esophagitis: Secondary | ICD-10-CM | POA: Insufficient documentation

## 2011-12-10 HISTORY — DX: Depression, unspecified: F32.A

## 2011-12-10 HISTORY — DX: Migraine, unspecified, not intractable, without status migrainosus: G43.909

## 2011-12-10 HISTORY — DX: Major depressive disorder, single episode, unspecified: F32.9

## 2011-12-10 HISTORY — DX: Supraventricular tachycardia, unspecified: I47.10

## 2011-12-10 HISTORY — DX: Supraventricular tachycardia: I47.1

## 2011-12-10 HISTORY — DX: Bipolar disorder, unspecified: F31.9

## 2011-12-10 LAB — COMPREHENSIVE METABOLIC PANEL
ALT: 7 U/L (ref 0–35)
Alkaline Phosphatase: 64 U/L (ref 39–117)
BUN: 10 mg/dL (ref 6–23)
CO2: 25 mEq/L (ref 19–32)
Calcium: 9.6 mg/dL (ref 8.4–10.5)
GFR calc Af Amer: >90 mL/min (ref >90)
GFR calc non Af Amer: >90 mL/min (ref >90)
Glucose, Bld: 96 mg/dL (ref 70–99)
Potassium: 3.5 mEq/L (ref 3.5–5.1)
Sodium: 137 mEq/L (ref 135–145)
Total Protein: 6.7 g/dL (ref 6.0–8.3)

## 2011-12-10 LAB — CBC WITH DIFFERENTIAL/PLATELET
Eosinophils Absolute: 0.1 10*3/uL (ref 0.0–0.7)
Eosinophils Relative: 2 % (ref 0–5)
HCT: 35.1 % — ABNORMAL LOW (ref 36.0–46.0)
Hemoglobin: 11.8 g/dL — ABNORMAL LOW (ref 12.0–15.0)
Lymphocytes Relative: 27 % (ref 12–46)
Lymphs Abs: 1.7 10*3/uL (ref 0.7–4.0)
MCH: 26.7 pg (ref 26.0–34.0)
MCV: 79.4 fL (ref 78.0–100.0)
Monocytes Relative: 7 % (ref 3–12)
Platelets: 244 10*3/uL (ref 150–400)
RBC: 4.42 MIL/uL (ref 3.87–5.11)
WBC: 6.3 10*3/uL (ref 4.0–10.5)

## 2011-12-10 LAB — WET PREP, GENITAL
Clue Cells Wet Prep HPF POC: NONE SEEN
Trich, Wet Prep: NONE SEEN
Yeast Wet Prep HPF POC: NONE SEEN

## 2011-12-10 LAB — HCG, QUANTITATIVE, PREGNANCY: hCG, Beta Chain, Quant, S: <1 m[IU]/mL (ref <5)

## 2011-12-10 LAB — URINALYSIS, ROUTINE W REFLEX MICROSCOPIC
Glucose, UA: NEGATIVE mg/dL
Ketones, ur: NEGATIVE mg/dL
Protein, ur: NEGATIVE mg/dL
Urobilinogen, UA: 0.2 mg/dL (ref 0.0–1.0)

## 2011-12-10 LAB — URINE MICROSCOPIC-ADD ON

## 2011-12-10 MED ORDER — IBUPROFEN 800 MG PO TABS
800.0000 mg | ORAL_TABLET | Freq: Once | ORAL | Status: AC
  Administered 2011-12-10: 800 mg via ORAL
  Filled 2011-12-10: qty 1

## 2011-12-10 MED ORDER — HYDROCODONE-ACETAMINOPHEN 5-325 MG PO TABS
1.0000 | ORAL_TABLET | Freq: Once | ORAL | Status: AC
  Administered 2011-12-10: 1 via ORAL
  Filled 2011-12-10: qty 1

## 2011-12-10 NOTE — ED Notes (Signed)
Asher Muir RN and Wallis Bamberg NT in room to obtain urine sample via in and out cath

## 2011-12-10 NOTE — ED Notes (Signed)
Pt states low abd/pelvic pain x1 day, also notes she is on her menstrual cycle and states she has seen what looks like "a piece of tissue" in her vaginal discharge. Also notes mucus like discharge for 1 month that has gotten worse.

## 2011-12-10 NOTE — ED Notes (Signed)
Al Decant PA in room doing pelvic exam accompanied by NT

## 2011-12-10 NOTE — ED Provider Notes (Signed)
History     CSN: [id number]  Arrival date & time 12/10/11  2022   First MD Initiated Contact with Patient 12/10/11 2205      Chief Complaint  Patient presents with  . Abdominal Pain  . Vaginal Pain    (Consider location/radiation/quality/duration/timing/severity/associated sxs/prior treatment) HPI Comments: Started menses yest.  Has had pelvic pain since onset.  Passed a "piece of tissue" this PM.  "i have this gooey stuff coming out of my vagina".  Denies fever or chills.  No UTI sxs.  Has known herpes but denies any new sxs.  One sexual partner who reportedly has no sxs.  Patient is a 31 y.o. female presenting with abdominal pain and vaginal pain. The history is provided by the patient. No language interpreter was used.  Abdominal Pain The primary symptoms of the illness include abdominal pain, diarrhea, vaginal discharge and vaginal bleeding. The primary symptoms of the illness do not include fever, nausea, vomiting or dysuria. Episode onset: sees dr. Garner Nash in [place] for "over 100 lb weight loss in the past year".  chronic abd pain and diarrhea.  The vaginal discharge is not associated with dysuria.  The patient states that she believes she is currently not pregnant. The patient has not had a change in bowel habit. Symptoms associated with the illness do not include chills, urgency, hematuria or frequency.  Vaginal Pain Associated symptoms include abdominal pain. Pertinent negatives include no chills, fever, nausea or vomiting.    Past Medical History  Diagnosis Date  . PAT (paroxysmal atrial tachycardia)   . SVT (supraventricular tachycardia)   . Bipolar disorder   . Depression   . Migraines   . Acid reflux     Past Surgical History  Procedure Date  . Cholecystectomy   . Tubal ligation     Family History  Problem Relation Age of Onset  . Heart disease Father   . Heart disease Other     History  Substance Use Topics  . Smoking status: Current Some Day Smoker --  1.0 packs/day    Types: Cigarettes  . Smokeless tobacco: Not on file  . Alcohol Use: Yes     occasionally    OB History    Grav Para Term Preterm Abortions TAB SAB Ect Mult Living                  Review of Systems  Constitutional: Negative for fever and chills.  Gastrointestinal: Positive for abdominal pain and diarrhea. Negative for nausea and vomiting.  Genitourinary: Positive for vaginal bleeding, vaginal discharge, vaginal pain and pelvic pain. Negative for dysuria, urgency, frequency and hematuria.  All other systems reviewed and are negative.    Allergies  Amoxicillin  Home Medications   Current Outpatient Rx  Name Route Sig Dispense Refill  . ALBUTEROL SULFATE HFA 108 (90 BASE) MCG/ACT IN AERS Inhalation Inhale 2 puffs into the lungs every 6 (six) hours as needed. For shortness of breath    . DIAZEPAM 10 MG PO TABS Oral Take 10 mg by mouth 3 (three) times daily.    Marland Kitchen DILTIAZEM HCL ER 240 MG PO CP24 Oral Take 240 mg by mouth daily.    Marland Kitchen DIVALPROEX SODIUM 500 MG PO TBEC Oral Take 1,500 mg by mouth at bedtime. For bi-polar    . OMEPRAZOLE 20 MG PO CPDR Oral Take 20 mg by mouth daily.      . SERTRALINE HCL 100 MG PO TABS Oral Take 100 mg by mouth 2 (  two) times daily.     Marland Kitchen VALACYCLOVIR HCL 500 MG PO TABS Oral Take 500 mg by mouth daily.        BP 102/56  Pulse 68  Temp 99.3 F (37.4 C) (Oral)  Resp 20  Ht 5' (1.524 m)  Wt 128 lb (58.06 kg)  BMI 25.00 kg/m2  SpO2 100%  LMP 12/02/2011  Physical Exam  Nursing note and vitals reviewed. Constitutional: She is oriented to person, place, and time. She appears well-developed and well-nourished. No distress.  HENT:  Head: Normocephalic and atraumatic.  Eyes: EOM are normal.  Neck: Normal range of motion.  Cardiovascular: Normal rate, regular rhythm and normal heart sounds.   Pulmonary/Chest: Effort normal and breath sounds normal.  Abdominal: Soft. She exhibits no distension. There is no tenderness.    Musculoskeletal: Normal range of motion.  Neurological: She is alert and oriented to person, place, and time.  Skin: Skin is warm and dry.  Psychiatric: She has a normal mood and affect. Judgment normal.    ED Course  Procedures (including critical care time)  Labs Reviewed  CBC WITH DIFFERENTIAL - Abnormal; Notable for the following:    Hemoglobin 11.8 (*)     HCT 35.1 (*)     RDW 17.0 (*)     All other components within normal limits  COMPREHENSIVE METABOLIC PANEL - Abnormal; Notable for the following:    Total Bilirubin 0.2 (*)     All other components within normal limits  URINALYSIS, ROUTINE W REFLEX MICROSCOPIC - Abnormal; Notable for the following:    Specific Gravity, Urine >1.030 (*)     Hgb urine dipstick TRACE (*)     Bilirubin Urine SMALL (*)     All other components within normal limits  WET PREP, GENITAL - Abnormal; Notable for the following:    WBC, Wet Prep HPF POC FEW (*)     All other components within normal limits  URINE MICROSCOPIC-ADD ON - Abnormal; Notable for the following:    Squamous Epithelial / LPF MANY (*)     Bacteria, UA MANY (*)     All other components within normal limits  HCG, QUANTITATIVE, PREGNANCY  PREGNANCY, URINE  GC/CHLAMYDIA PROBE AMP, GENITAL  RPR   No results found.   1. Pain, pelvic, female   2. Menses painful       MDM  Wet prep- WNL GC, chlamydia and RPR pending. F/u with your PCP or GYN        Evalina Field, PA 12/11/11 0017  Evalina Field, PA 12/11/11 [id number]

## 2011-12-10 NOTE — ED Notes (Signed)
I am having abdominal pain and vaginal pain that is bad as having a baby per pt. I am currently on my period and I had some tissue come out before I came here per pt.

## 2011-12-11 MED ORDER — HYDROCODONE-ACETAMINOPHEN 5-325 MG PO TABS: 1.0000 | ORAL_TABLET | Freq: Four times a day (QID) | ORAL | Status: AC | PRN

## 2011-12-11 NOTE — ED Provider Notes (Signed)
Medical screening examination/treatment/procedure(s) were performed by non-physician practitioner and as supervising physician I was immediately available for consultation/collaboration.   Yaeli Hartung L Jakyah Bradby, MD 12/11/11 1627 

## 2012-01-05 ENCOUNTER — Emergency Department (HOSPITAL_COMMUNITY)
Admission: EM | Admit: 2012-01-05 | Discharge: 2012-01-05 | Disposition: A | Discharge status: Home / Self Care | Payer: Medicaid Other | Attending: Emergency Medicine | Admitting: Emergency Medicine

## 2012-01-05 ENCOUNTER — Encounter (HOSPITAL_COMMUNITY): Payer: Self-pay

## 2012-01-05 ENCOUNTER — Emergency Department (HOSPITAL_COMMUNITY): Payer: Medicaid Other

## 2012-01-05 DIAGNOSIS — B001 Herpesviral vesicular dermatitis: Secondary | ICD-10-CM

## 2012-01-05 DIAGNOSIS — R0602 Shortness of breath: Secondary | ICD-10-CM | POA: Insufficient documentation

## 2012-01-05 DIAGNOSIS — F329 Major depressive disorder, single episode, unspecified: Secondary | ICD-10-CM | POA: Insufficient documentation

## 2012-01-05 DIAGNOSIS — F319 Bipolar disorder, unspecified: Secondary | ICD-10-CM | POA: Insufficient documentation

## 2012-01-05 DIAGNOSIS — F3289 Other specified depressive episodes: Secondary | ICD-10-CM | POA: Insufficient documentation

## 2012-01-05 DIAGNOSIS — Z79899 Other long term (current) drug therapy: Secondary | ICD-10-CM | POA: Insufficient documentation

## 2012-01-05 DIAGNOSIS — K219 Gastro-esophageal reflux disease without esophagitis: Secondary | ICD-10-CM | POA: Insufficient documentation

## 2012-01-05 DIAGNOSIS — A6000 Herpesviral infection of urogenital system, unspecified: Secondary | ICD-10-CM | POA: Insufficient documentation

## 2012-01-05 DIAGNOSIS — F17200 Nicotine dependence, unspecified, uncomplicated: Secondary | ICD-10-CM | POA: Insufficient documentation

## 2012-01-05 MED ORDER — OXYCODONE-ACETAMINOPHEN 5-325 MG PO TABS
2.0000 | ORAL_TABLET | Freq: Once | ORAL | Status: AC
  Administered 2012-01-05: 2 via ORAL
  Filled 2012-01-05: qty 2

## 2012-01-05 MED ORDER — VALACYCLOVIR HCL 1 G PO TABS: 500.0000 mg | ORAL_TABLET | Freq: Three times a day (TID) | ORAL | Status: AC

## 2012-01-05 MED ORDER — VALACYCLOVIR HCL 500 MG PO TABS
1000.0000 mg | ORAL_TABLET | Freq: Once | ORAL | Status: AC
  Administered 2012-01-05: 1000 mg via ORAL
  Filled 2012-01-05: qty 2

## 2012-01-05 MED ORDER — HYDROCODONE-ACETAMINOPHEN 7.5-500 MG/15ML PO SOLN: 15.0000 mL | Freq: Four times a day (QID) | ORAL | Status: DC | PRN

## 2012-01-05 MED ORDER — LORAZEPAM 1 MG PO TABS
1.0000 mg | ORAL_TABLET | Freq: Once | ORAL | Status: AC
Start: 2012-01-05 — End: 2012-01-05
  Administered 2012-01-05: 1 mg via ORAL
  Filled 2012-01-05: qty 1

## 2012-01-05 NOTE — ED Notes (Signed)
Pain 7/10 perineum,  0/10 chest

## 2012-01-05 NOTE — ED Notes (Signed)
Pt was crying "alot" from genital Herpes pain when she started feeling left sided chest pain. Denies rapid HR. Now 3/10 pain.

## 2012-01-05 NOTE — ED Provider Notes (Signed)
History     CSN: [id number]  Arrival date & time 01/05/12  0348   None     No chief complaint on file. SCC: CP  (Consider location/radiation/quality/duration/timing/severity/associated sxs/prior treatment) HPI Hx per PT. " I think I am having a panic attack" CP and SOB that she attributes to pain from a herpes outbreak. She has not had an outbreak in years. Has vaginal blisters. Burning in quality, onset 2 days ago worse tonight. Mod in severity. No F/C. Pain constant, not radiating. No known alleviating fators Past Medical History  Diagnosis Date  . PAT (paroxysmal atrial tachycardia)   . SVT (supraventricular tachycardia)   . Bipolar disorder   . Depression   . Migraines   . Acid reflux     Past Surgical History  Procedure Date  . Cholecystectomy   . Tubal ligation     Family History  Problem Relation Age of Onset  . Heart disease Father   . Heart disease Other     History  Substance Use Topics  . Smoking status: Current Some Day Smoker -- 1.0 packs/day    Types: Cigarettes  . Smokeless tobacco: Not on file  . Alcohol Use: Yes     Comment: occasionally    OB History    Grav Para Term Preterm Abortions TAB SAB Ect Mult Living                  Review of Systems  Constitutional: Negative for fever and chills.  HENT: Negative for neck pain and neck stiffness.   Eyes: Negative for pain.  Respiratory: Positive for shortness of breath.   Cardiovascular: Positive for chest pain.  Gastrointestinal: Negative for abdominal pain.  Genitourinary: Positive for vaginal pain. Negative for dysuria.  Musculoskeletal: Negative for back pain.  Skin: Negative for rash.  Neurological: Negative for headaches.  All other systems reviewed and are negative.    Allergies  Amoxicillin  Home Medications   Current Outpatient Rx  Name  Route  Sig  Dispense  Refill  . ALBUTEROL SULFATE HFA 108 (90 BASE) MCG/ACT IN AERS   Inhalation   Inhale 2 puffs into the lungs every 6  (six) hours as needed. For shortness of breath         . DIAZEPAM 10 MG PO TABS   Oral   Take 10 mg by mouth 3 (three) times daily.         Marland Kitchen DILTIAZEM HCL ER 240 MG PO CP24   Oral   Take 240 mg by mouth daily.         Marland Kitchen DIVALPROEX SODIUM 500 MG PO TBEC   Oral   Take 1,500 mg by mouth at bedtime. For bi-polar         . OMEPRAZOLE 20 MG PO CPDR   Oral   Take 20 mg by mouth daily.           . SERTRALINE HCL 100 MG PO TABS   Oral   Take 100 mg by mouth 2 (two) times daily.          Marland Kitchen VALACYCLOVIR HCL 500 MG PO TABS   Oral   Take 500 mg by mouth daily.             LMP 12/02/2011  Physical Exam  Constitutional: She is oriented to person, place, and time. She appears well-developed and well-nourished.  HENT:  Head: Normocephalic and atraumatic.  Eyes: Conjunctivae normal and EOM are normal. Pupils are equal, round,  and reactive to light.  Neck: Trachea normal. Neck supple. No thyromegaly present.  Cardiovascular: Normal rate, regular rhythm, S1 normal, S2 normal and normal pulses.     No systolic murmur is present   No diastolic murmur is present  Pulses:      Radial pulses are 2+ on the right side, and 2+ on the left side.  Pulmonary/Chest: Effort normal and breath sounds normal. She has no wheezes. She has no rhonchi. She has no rales. She exhibits no tenderness.  Abdominal: Soft. Normal appearance and bowel sounds are normal. There is no tenderness. There is no CVA tenderness and negative Murphy's sign.  Genitourinary:       Ext GU exam with chaperone: erythematous blisters c/w h/o herpes  Musculoskeletal:       BLE:s Calves nontender, no cords or erythema, negative Homans sign  Neurological: She is alert and oriented to person, place, and time. She has normal strength. No cranial nerve deficit or sensory deficit. GCS eye subscore is 4. GCS verbal subscore is 5. GCS motor subscore is 6.  Skin: Skin is warm and dry. No rash noted. She is not diaphoretic.    Psychiatric: Her speech is normal.       Cooperative and appropriate    ED Course  Procedures (including critical care time)   Date: 01/05/2012  Rate: 74  Rhythm: normal sinus rhythm  QRS Axis: normal  Intervals: normal  ST/T Wave abnormalities: nonspecific ST changes  Conduction Disutrbances:none  Narrative Interpretation:   Old EKG Reviewed: none available  PO Ativan  PO Valtrex  Panic attack improved. ECG reviewed. No indication for CXR normal cardiopulmonary exam. RX provided and plan f/u PCP in [place].   MDM   Herpes outbreak and associated panic attack. ECG and medications as above. VS and nursing notes reviewed and considered.         Sunnie Nielsen, MD 01/05/12 [phone]

## 2012-02-22 ENCOUNTER — Encounter (HOSPITAL_COMMUNITY): Payer: Self-pay | Admitting: *Deleted

## 2012-02-22 ENCOUNTER — Emergency Department (HOSPITAL_COMMUNITY)
Admission: EM | Admit: 2012-02-22 | Discharge: 2012-02-22 | Disposition: A | Discharge status: Home / Self Care | Payer: Medicaid Other | Attending: Emergency Medicine | Admitting: Emergency Medicine

## 2012-02-22 DIAGNOSIS — F319 Bipolar disorder, unspecified: Secondary | ICD-10-CM | POA: Insufficient documentation

## 2012-02-22 DIAGNOSIS — F17200 Nicotine dependence, unspecified, uncomplicated: Secondary | ICD-10-CM | POA: Insufficient documentation

## 2012-02-22 DIAGNOSIS — R590 Localized enlarged lymph nodes: Secondary | ICD-10-CM

## 2012-02-22 DIAGNOSIS — Z8679 Personal history of other diseases of the circulatory system: Secondary | ICD-10-CM | POA: Insufficient documentation

## 2012-02-22 DIAGNOSIS — F3289 Other specified depressive episodes: Secondary | ICD-10-CM | POA: Insufficient documentation

## 2012-02-22 DIAGNOSIS — K219 Gastro-esophageal reflux disease without esophagitis: Secondary | ICD-10-CM | POA: Insufficient documentation

## 2012-02-22 DIAGNOSIS — F329 Major depressive disorder, single episode, unspecified: Secondary | ICD-10-CM | POA: Insufficient documentation

## 2012-02-22 DIAGNOSIS — Z9851 Tubal ligation status: Secondary | ICD-10-CM | POA: Insufficient documentation

## 2012-02-22 DIAGNOSIS — R599 Enlarged lymph nodes, unspecified: Secondary | ICD-10-CM | POA: Insufficient documentation

## 2012-02-22 DIAGNOSIS — Z79899 Other long term (current) drug therapy: Secondary | ICD-10-CM | POA: Insufficient documentation

## 2012-02-22 MED ORDER — SULFAMETHOXAZOLE-TRIMETHOPRIM 800-160 MG PO TABS: 1.0000 | ORAL_TABLET | Freq: Two times a day (BID) | ORAL | Status: DC

## 2012-02-22 MED ORDER — HYDROCODONE-ACETAMINOPHEN 5-325 MG PO TABS
ORAL_TABLET | ORAL | Status: AC
  Administered 2012-02-22: 1
  Filled 2012-02-22: qty 1

## 2012-02-22 MED ORDER — SULFAMETHOXAZOLE-TMP DS 800-160 MG PO TABS
ORAL_TABLET | ORAL | Status: AC
  Administered 2012-02-22: 1
  Filled 2012-02-22: qty 1

## 2012-02-22 NOTE — ED Notes (Signed)
Pt has swollen lymph node to lt inguinal area,  Tender to palp.

## 2012-02-22 NOTE — ED Notes (Signed)
Pt states abscess to left upper thigh. First noticed this morning.

## 2012-02-22 NOTE — ED Provider Notes (Signed)
History     CSN: [id number]  Arrival date & time 02/22/12  4098   First MD Initiated Contact with Patient 02/22/12 2125      Chief Complaint  Patient presents with  . Abscess    (Consider location/radiation/quality/duration/timing/severity/associated sxs/prior treatment) HPI Comments: Painful swollen area in L groin  Over the past week.  Denies any localized infections.  Patient is a 31 y.o. female presenting with abscess. The history is provided by the patient. No language interpreter was used.  Abscess  This is a new problem. Episode onset: several days ago. The onset was sudden. The problem has been unchanged. The abscess is present on the groin. The problem is moderate. The abscess is characterized by painfulness. The abscess first occurred at home. Pertinent negatives include no fever. Her past medical history does not include atopy in family or skin abscesses in family. She has received no recent medical care.    Past Medical History  Diagnosis Date  . PAT (paroxysmal atrial tachycardia)   . SVT (supraventricular tachycardia)   . Bipolar disorder   . Depression   . Migraines   . Acid reflux     Past Surgical History  Procedure Date  . Cholecystectomy   . Tubal ligation     Family History  Problem Relation Age of Onset  . Heart disease Father   . Heart disease Other     History  Substance Use Topics  . Smoking status: Current Some Day Smoker -- 1.0 packs/day    Types: Cigarettes  . Smokeless tobacco: Not on file  . Alcohol Use: Yes     Comment: occasionally    OB History    Grav Para Term Preterm Abortions TAB SAB Ect Mult Living                  Review of Systems  Constitutional: Negative for fever and chills.  Genitourinary: Negative for urgency, frequency, hematuria, flank pain, vaginal bleeding, vaginal discharge, vaginal pain and pelvic pain.  All other systems reviewed and are negative.    Allergies  Amoxicillin  Home Medications    Current Outpatient Rx  Name  Route  Sig  Dispense  Refill  . ALBUTEROL SULFATE HFA 108 (90 BASE) MCG/ACT IN AERS   Inhalation   Inhale 2 puffs into the lungs every 6 (six) hours as needed. For shortness of breath         . DIAZEPAM 10 MG PO TABS   Oral   Take 10 mg by mouth 3 (three) times daily.         Marland Kitchen DILTIAZEM HCL ER 240 MG PO CP24   Oral   Take 240 mg by mouth daily.         Marland Kitchen DIVALPROEX SODIUM 500 MG PO TBEC   Oral   Take 1,000 mg by mouth at bedtime. For bi-polar         . OMEPRAZOLE 20 MG PO CPDR   Oral   Take 20 mg by mouth daily.           Marland Kitchen PRESCRIPTION MEDICATION   Oral   Take 1 tablet by mouth daily. BIRTH CONTROL prescribed to treat Endometriosis         . SERTRALINE HCL 100 MG PO TABS   Oral   Take 100 mg by mouth 2 (two) times daily.          Marland Kitchen VALACYCLOVIR HCL 500 MG PO TABS   Oral   Take 500  mg by mouth daily.           . SULFAMETHOXAZOLE-TRIMETHOPRIM 800-160 MG PO TABS   Oral   Take 1 tablet by mouth every 12 (twelve) hours.   20 tablet   0     BP 106/60  Pulse 110  Temp 98.9 F (37.2 C) (Oral)  Resp 16  Ht 5' (1.524 m)  Wt 123 lb (55.792 kg)  BMI 24.02 kg/m2  SpO2 100%  LMP 02/20/2012  Physical Exam  Nursing note and vitals reviewed. Constitutional: She is oriented to person, place, and time. She appears well-developed and well-nourished. No distress.  HENT:  Head: Normocephalic and atraumatic.  Eyes: EOM are normal.  Neck: Normal range of motion.  Cardiovascular: Normal rate, regular rhythm and normal heart sounds.   Pulmonary/Chest: Effort normal and breath sounds normal.  Abdominal: Soft. She exhibits no distension. There is no tenderness.  Genitourinary: Vagina normal.    No vaginal discharge found.  Musculoskeletal: Normal range of motion.  Neurological: She is alert and oriented to person, place, and time.  Skin: Skin is warm and dry.  Psychiatric: She has a normal mood and affect. Judgment normal.     ED Course  Procedures (including critical care time)  Labs Reviewed - No data to display No results found.   1. LAD (lymphadenopathy), inguinal       MDM  rx-bactrim DS        Evalina Field, PA 02/24/12 1718

## 2012-02-25 NOTE — ED Provider Notes (Signed)
Medical screening examination/treatment/procedure(s) were performed by non-physician practitioner and as supervising physician I was immediately available for consultation/collaboration.  Lakendria Nicastro, MD 02/25/12 2045 

## 2012-12-31 ENCOUNTER — Encounter: Payer: Self-pay | Admitting: Cardiology

## 2012-12-31 DIAGNOSIS — F329 Major depressive disorder, single episode, unspecified: Secondary | ICD-10-CM | POA: Insufficient documentation

## 2012-12-31 DIAGNOSIS — F319 Bipolar disorder, unspecified: Secondary | ICD-10-CM | POA: Insufficient documentation

## 2012-12-31 DIAGNOSIS — I471 Supraventricular tachycardia: Secondary | ICD-10-CM | POA: Insufficient documentation

## 2012-12-31 DIAGNOSIS — G43909 Migraine, unspecified, not intractable, without status migrainosus: Secondary | ICD-10-CM | POA: Insufficient documentation

## 2012-12-31 DIAGNOSIS — R9430 Abnormal result of cardiovascular function study, unspecified: Secondary | ICD-10-CM | POA: Insufficient documentation

## 2012-12-31 DIAGNOSIS — K219 Gastro-esophageal reflux disease without esophagitis: Secondary | ICD-10-CM | POA: Insufficient documentation

## 2013-01-02 ENCOUNTER — Encounter: Payer: Self-pay | Admitting: Cardiology

## 2013-01-02 ENCOUNTER — Ambulatory Visit (INDEPENDENT_AMBULATORY_CARE_PROVIDER_SITE_OTHER): Payer: Medicaid Other | Admitting: Cardiology

## 2013-01-02 ENCOUNTER — Other Ambulatory Visit: Payer: Self-pay | Admitting: Cardiology

## 2013-01-02 VITALS — BP 107/71 | HR 107 | Ht 60.0 in | Wt 132.0 lb

## 2013-01-02 DIAGNOSIS — I498 Other specified cardiac arrhythmias: Secondary | ICD-10-CM

## 2013-01-02 DIAGNOSIS — Z720 Tobacco use: Secondary | ICD-10-CM | POA: Insufficient documentation

## 2013-01-02 DIAGNOSIS — R9430 Abnormal result of cardiovascular function study, unspecified: Secondary | ICD-10-CM

## 2013-01-02 DIAGNOSIS — R0989 Other specified symptoms and signs involving the circulatory and respiratory systems: Secondary | ICD-10-CM

## 2013-01-02 DIAGNOSIS — F17200 Nicotine dependence, unspecified, uncomplicated: Secondary | ICD-10-CM

## 2013-01-02 DIAGNOSIS — R002 Palpitations: Secondary | ICD-10-CM | POA: Insufficient documentation

## 2013-01-02 DIAGNOSIS — I471 Supraventricular tachycardia: Secondary | ICD-10-CM

## 2013-01-02 MED ORDER — METOPROLOL SUCCINATE ER 25 MG PO TB24: 25.0000 mg | ORAL_TABLET | ORAL | Status: DC

## 2013-01-02 NOTE — Patient Instructions (Addendum)
Your physician recommends that you schedule a follow-up appointment in: 2-3 week.  Your physician has recommended you make the following change in your medication: Start metoprolol succinate (toprol xl) 25 mg daily. PLEASE TAKE 1 TABLET DAILY, AFTER 3 RD DAY, IF YOU TOLERATE IT OKAY, PLEASE INCREASE TO 1 TABLET TWICE DAILY. All other medications will remain the same. Your new prescription has been sent to your pharmacy. Your physician has requested that you have an echocardiogram. Echocardiography is a painless test that uses sound waves to create images of your heart. It provides your doctor with information about the size and shape of your heart and how well your heart's chambers and valves are working. This procedure takes approximately one hour. There are no restrictions for this procedure.

## 2013-01-02 NOTE — Progress Notes (Signed)
HPI  Patient is seen today to reestablish cardiac care and to have further assessment for her palpitations. She has been seen by our group in the past. She was seen last in the office in 2007. There is a history of normal left ventricular function. There is a history of paroxysmal supraventricular tachycardia. In the past she was on 100 mg of Toprol-XL. This was the morning dose and she had 50 mg in the evening. Over the past 6 months she is on diltiazem. She feels this is not working as well. Recently she felt palpitations and went to the emergency room. I've completely reviewed the emergency room records. There was sinus rhythm. She had no major supraventricular arrhythmias. She says she continues to feel the palpitations. It is of note that she has resting sinus tachycardia. I will have to review again to see if her thyroid was checked.  Allergies  Allergen Reactions  . Amoxicillin Shortness Of Breath, Swelling and Rash    Current Outpatient Prescriptions  Medication Sig Dispense Refill  . azithromycin (ZITHROMAX) 500 MG tablet Take 500 mg by mouth as directed.      . diazepam (VALIUM) 10 MG tablet Take 10 mg by mouth 3 (three) times daily.      Marland Kitchen diltiazem (DILACOR XR) 240 MG 24 hr capsule Take 240 mg by mouth daily.      . divalproex (DEPAKOTE) 500 MG DR tablet Take 1,500 mg by mouth at bedtime. For bi-polar      . mirtazapine (REMERON) 15 MG tablet Take 15 mg by mouth at bedtime.      Marland Kitchen omeprazole (PRILOSEC) 20 MG capsule Take 20 mg by mouth daily.        Marland Kitchen PRESCRIPTION MEDICATION Take 1 tablet by mouth daily. BIRTH CONTROL prescribed to treat Endometriosis       No current facility-administered medications for this visit.    History   Social History  . Marital Status: Married    Spouse Name: N/A    Number of Children: N/A  . Years of Education: N/A   Occupational History  . disabled    Social History Main Topics  . Smoking status: Current Some Day Smoker -- 1.00  packs/day for 10 years    Types: Cigarettes  . Smokeless tobacco: Not on file  . Alcohol Use: Yes     Comment: occasionally  . Drug Use: Yes    Special: Marijuana  . Sexual Activity: Yes    Birth Control/ Protection: Surgical   Other Topics Concern  . Not on file   Social History Narrative   Married   No regular exercise    Family History  Problem Relation Age of Onset  . Heart disease Father   . Heart disease Other     Past Medical History  Diagnosis Date  . SVT (supraventricular tachycardia)   . Bipolar disorder   . Depression   . Migraines   . GERD (gastroesophageal reflux disease)   . Ejection fraction     Past Surgical History  Procedure Laterality Date  . Cholecystectomy    . Tubal ligation      Patient Active Problem List   Diagnosis Date Noted  . SVT (supraventricular tachycardia)   . Bipolar disorder   . Depression   . Migraines   . GERD (gastroesophageal reflux disease)   . Ejection fraction     ROS   Patient denies fever, chills, headache, sweats, rash, change in vision, change in hearing, chest  pain, cough, nausea vomiting, urinary symptoms. All other systems are reviewed and are negative.  PHYSICAL EXAM   Patient is oriented to person time and place. Affect is normal. She's here with her mother. There is no jugulovenous distention. There no carotid bruits. Lungs are clear. Respiratory effort is nonlabored. Cardiac exam reveals S1 and S2. There no clicks or significant murmurs. Abdomen is soft. There is no peripheral edema. There no musculoskeletal deformities. There are no skin rashes.  Filed Vitals:   01/02/13 0950  BP: 107/71  Pulse: 107  Height: 5' (1.524 m)  Weight: 132 lb (59.875 kg)   EKG is done today and reviewed by me. There is sinus rhythm with sinus tachycardia at 105. There is no QRS abnormality.  ASSESSMENT & PLAN

## 2013-01-02 NOTE — Assessment & Plan Note (Addendum)
Patient continues to feel palpitations. She has mild resting sinus tachycardia today. There is a history of supraventricular tachycardia in the past. She's been on a beta blocker in the past. More recently she is on diltiazem. Today we will start low-dose beta blocker and leave her on the diltiazem. I will then see her back for followup to readjust the medicines. However check to see if she had a recent TSH. She does not use significant caffeine. There was a TSH done in August, 2014. That was normal.  As part of today's evaluation I have searched old records. Eventually I found a progress note from October, 2007. I personally reviewed the records within the hospital system concerning her recent emergency room visit. I've completely updated the new electronic medical record

## 2013-01-02 NOTE — Assessment & Plan Note (Signed)
There is a history of rapid supraventricular tachycardia in the past. So far this has not been proven to be related to her recent symptoms. She felt palpitations in the emergency room when she had sinus rhythm. I will consider an event recorder over time.

## 2013-01-02 NOTE — Assessment & Plan Note (Signed)
Historically her ejection fraction was normal in 2000. This needs to be rechecked by echo at this time and this will be scheduled.

## 2013-01-02 NOTE — Assessment & Plan Note (Signed)
The patient is counseled to stop smoking.

## 2013-01-08 ENCOUNTER — Other Ambulatory Visit (INDEPENDENT_AMBULATORY_CARE_PROVIDER_SITE_OTHER): Payer: Medicaid Other

## 2013-01-08 ENCOUNTER — Other Ambulatory Visit: Payer: Self-pay

## 2013-01-08 DIAGNOSIS — I471 Supraventricular tachycardia: Secondary | ICD-10-CM

## 2013-01-08 DIAGNOSIS — I498 Other specified cardiac arrhythmias: Secondary | ICD-10-CM

## 2013-01-08 DIAGNOSIS — R002 Palpitations: Secondary | ICD-10-CM

## 2013-01-10 ENCOUNTER — Encounter: Payer: Self-pay | Admitting: Cardiology

## 2013-01-12 ENCOUNTER — Ambulatory Visit (INDEPENDENT_AMBULATORY_CARE_PROVIDER_SITE_OTHER): Payer: Medicaid Other | Admitting: Cardiology

## 2013-01-12 ENCOUNTER — Encounter: Payer: Self-pay | Admitting: Cardiology

## 2013-01-12 VITALS — BP 103/65 | HR 76 | Ht 60.0 in | Wt 133.0 lb

## 2013-01-12 DIAGNOSIS — R9430 Abnormal result of cardiovascular function study, unspecified: Secondary | ICD-10-CM

## 2013-01-12 DIAGNOSIS — R002 Palpitations: Secondary | ICD-10-CM

## 2013-01-12 DIAGNOSIS — R0989 Other specified symptoms and signs involving the circulatory and respiratory systems: Secondary | ICD-10-CM

## 2013-01-12 DIAGNOSIS — I498 Other specified cardiac arrhythmias: Secondary | ICD-10-CM

## 2013-01-12 DIAGNOSIS — I471 Supraventricular tachycardia: Secondary | ICD-10-CM

## 2013-01-12 MED ORDER — METOPROLOL SUCCINATE ER 25 MG PO TB24: 25.0000 mg | ORAL_TABLET | Freq: Two times a day (BID) | ORAL | Status: DC | PRN

## 2013-01-12 MED ORDER — DILTIAZEM HCL ER 240 MG PO CP24: 240.0000 mg | ORAL_CAPSULE | Freq: Every day | ORAL | Status: DC

## 2013-01-12 NOTE — Assessment & Plan Note (Signed)
There is a history of supraventricular tachycardia and she remains on diltiazem for this. She also has a component of sinus tachycardia. Small dose of beta blockade is helping with this. We will continue the same therapy.

## 2013-01-12 NOTE — Assessment & Plan Note (Signed)
Echo done November, 2014 reveals excellent LV function. No further workup is needed.

## 2013-01-12 NOTE — Progress Notes (Signed)
HPI Patient is seen to followup with her palpitations. I saw her last January 02, 2013. There is a history of supraventricular tachycardia. The patient is also bothered by sinus tachycardia. I saw her recently we decided to proceed with a 2-D echo. It was done. Ejection fraction was 60-65% with no wall motion abnormalities. In the past she had been on a beta blocker. Over time she felt draggy with this and she was changed to diltiazem. When I saw her recently I continue her diltiazem and added back a small dose of the beta blocker. She is taking this in the midafternoon, as her most stressful time of the day is later in the afternoon. This has definitely been helpful. She is feeling better.  Allergies  Allergen Reactions  . Amoxicillin Shortness Of Breath, Swelling and Rash    Current Outpatient Prescriptions  Medication Sig Dispense Refill  . diazepam (VALIUM) 10 MG tablet Take 10 mg by mouth 3 (three) times daily.      Marland Kitchen diltiazem (DILACOR XR) 240 MG 24 hr capsule Take 240 mg by mouth daily.      . divalproex (DEPAKOTE) 500 MG DR tablet Take 1,500 mg by mouth at bedtime. For bi-polar      . metoprolol succinate (TOPROL-XL) 25 MG 24 hr tablet Take 25 mg by mouth 2 (two) times daily as needed.      . mirtazapine (REMERON) 15 MG tablet Take 15 mg by mouth at bedtime.      Marland Kitchen omeprazole (PRILOSEC) 20 MG capsule Take 20 mg by mouth daily.        Marland Kitchen PRESCRIPTION MEDICATION Take 1 tablet by mouth daily. BIRTH CONTROL prescribed to treat Endometriosis       No current facility-administered medications for this visit.    History   Social History  . Marital Status: Married    Spouse Name: N/A    Number of Children: N/A  . Years of Education: N/A   Occupational History  . disabled    Social History Main Topics  . Smoking status: Current Some Day Smoker -- 1.00 packs/day for 10 years    Types: Cigarettes  . Smokeless tobacco: Never Used  . Alcohol Use: Yes     Comment: occasionally  .  Drug Use: Yes    Special: Marijuana  . Sexual Activity: Yes    Birth Control/ Protection: Surgical   Other Topics Concern  . Not on file   Social History Narrative   Married   No regular exercise    Family History  Problem Relation Age of Onset  . Heart disease Father   . Heart disease Other     Past Medical History  Diagnosis Date  . SVT (supraventricular tachycardia)   . Bipolar disorder   . Depression   . Migraines   . GERD (gastroesophageal reflux disease)   . Ejection fraction   . Tobacco abuse   . Palpitations     Past Surgical History  Procedure Laterality Date  . Cholecystectomy    . Tubal ligation      Patient Active Problem List   Diagnosis Date Noted  . Tobacco abuse   . Palpitations   . SVT (supraventricular tachycardia)   . Bipolar disorder   . Depression   . Migraines   . GERD (gastroesophageal reflux disease)   . Ejection fraction     ROS  Patient denies fever, chills, headache, sweats, rash, change in vision, change in hearing, chest pain, cough, nausea  vomiting, urinary symptoms. All other systems are reviewed and are negative.   PHYSICAL EXAM Patient is oriented to person time and place. Affect is normal. There is no jugular venous distention. Lungs are clear. Respiratory effort is nonlabored. Cardiac exam reveals S1 and S2. There no clicks or significant murmurs. The abdomen is soft. Is no peripheral edema. Filed Vitals:   01/12/13 0842  BP: 103/65  Pulse: 76  Height: 5' (1.524 m)  Weight: 133 lb (60.328 kg)     ASSESSMENT & PLAN

## 2013-01-12 NOTE — Patient Instructions (Signed)

## 2013-01-12 NOTE — Assessment & Plan Note (Signed)
Her most recent symptoms were related to sinus tachycardia. With the addition of a beta blocker to her diltiazem she is now quite stable.

## 2014-02-10 ENCOUNTER — Other Ambulatory Visit: Payer: Self-pay | Admitting: Cardiology

## 2014-08-25 ENCOUNTER — Encounter: Payer: Self-pay | Admitting: Cardiology

## 2014-08-25 ENCOUNTER — Ambulatory Visit (INDEPENDENT_AMBULATORY_CARE_PROVIDER_SITE_OTHER): Payer: Medicaid Other | Admitting: Cardiology

## 2014-08-25 VITALS — BP 100/68 | HR 85 | Ht 60.0 in | Wt 144.0 lb

## 2014-08-25 DIAGNOSIS — Z8679 Personal history of other diseases of the circulatory system: Secondary | ICD-10-CM | POA: Diagnosis not present

## 2014-08-25 DIAGNOSIS — R9430 Abnormal result of cardiovascular function study, unspecified: Secondary | ICD-10-CM

## 2014-08-25 DIAGNOSIS — R0989 Other specified symptoms and signs involving the circulatory and respiratory systems: Secondary | ICD-10-CM | POA: Diagnosis not present

## 2014-08-25 DIAGNOSIS — Z01810 Encounter for preprocedural cardiovascular examination: Secondary | ICD-10-CM | POA: Diagnosis not present

## 2014-08-25 DIAGNOSIS — I471 Supraventricular tachycardia: Secondary | ICD-10-CM

## 2014-08-25 NOTE — Progress Notes (Signed)
Cardiology Office Note   Date:  08/25/2014   ID:  Gwendolyn [name], DOB [birth year], MRN [id number]  PCP:  Donzetta Sprung, MD  Cardiologist:  Willa Rough, MD   Chief Complaint  Patient presents with  . Appointment    Follow-up palpitations. Preop clearance for possible knee surgery      History of Present Illness: Gwendolyn [name] is a 34 y.o. female who presents today to follow-up a history of supraventricular tachycardia and sinus tachycardia. She is also here for preop clearance for possible knee surgery. I saw the patient last in November, 2014. She has a history of supraventricular tachycardia in the remote past. She's also been bothered by sinus tachycardia in the past. Two-dimensional echo in 2014 revealed an ejection fraction of 60-65%. At one point she was on both diltiazem and a beta blocker. Since that time her beta blocker has been stopped by her primary physician. She does have some palpitations from time to time. She's not having any chest pain, shortness of breath. There's been no syncope.    Past Medical History  Diagnosis Date  . SVT (supraventricular tachycardia)   . Bipolar disorder   . Depression   . Migraines   . GERD (gastroesophageal reflux disease)   . Ejection fraction   . Tobacco abuse   . Palpitations     Past Surgical History  Procedure Laterality Date  . Cholecystectomy    . Tubal ligation      Patient Active Problem List   Diagnosis Date Noted  . Pre-operative cardiovascular examination 08/25/2014  . H/O sinus tachycardia 08/25/2014  . Tobacco abuse   . Palpitations   . SVT (supraventricular tachycardia)   . Bipolar disorder   . Depression   . Migraines   . GERD (gastroesophageal reflux disease)   . Ejection fraction       Current Outpatient Prescriptions  Medication Sig Dispense Refill  . diazepam (VALIUM) 10 MG tablet Take 10 mg by mouth 3 (three) times daily.    Marland Kitchen diltiazem (DILACOR XR) 240 MG 24 hr capsule Take 1 capsule (240 mg  total) by mouth daily. 30 capsule 11  . divalproex (DEPAKOTE) 500 MG DR tablet Take 1,500 mg by mouth at bedtime. For bi-polar    . omeprazole (PRILOSEC) 20 MG capsule Take 20 mg by mouth daily.      Marland Kitchen PRESCRIPTION MEDICATION Take 1 tablet by mouth daily. BIRTH CONTROL prescribed to treat Endometriosis     No current facility-administered medications for this visit.    Allergies:   Amoxicillin    Social History:  The patient  reports that she has been smoking Cigarettes.  She has a 10 pack-year smoking history. She has never used smokeless tobacco. She reports that she drinks alcohol. She reports that she uses illicit drugs (Marijuana).   Family History:  [name]  family history includes Heart disease in her father and other.    ROS:  Please see the history of present illness.     Patient denies fever, chills, headache, sweats, rash, change in vision, change in hearing, chest pain, cough, nausea or vomiting, urinary symptoms. All other systems are reviewed and are negative.   PHYSICAL EXAM: VS:  BP 100/68 mmHg  Pulse 85  Ht 5' (1.524 m)  Wt 144 lb (65.318 kg)  BMI 28.12 kg/m2  SpO2 99% , Patient is oriented to person time and place. Affect is normal. Head is atraumatic. Sclera and conjunctiva are normal. There is no jugular  venous distention. Lungs are clear. Respiratory effort is nonlabored. Cardiac exam reveals S1 and S2. The rhythm is regular. The abdomen is soft. There is no peripheral edema. There are no musculoskeletal deformities. There are no skin rashes. Neurologic is grossly intact.  EKG:   EKG is done today and reviewed by me. There is normal sinus rhythm. The EKG is normal.   Recent Labs: No results found for requested labs within last 365 days.    Lipid Panel No results found for: CHOL, TRIG, HDL, CHOLHDL, VLDL, LDLCALC, LDLDIRECT    Wt Readings from Last 3 Encounters:  08/25/14 144 lb (65.318 kg)  01/12/13 133 lb (60.328 kg)  01/02/13 132 lb (59.875 kg)        Current medicines are reviewed  The patient understands her medications.     ASSESSMENT AND PLAN:

## 2014-08-25 NOTE — Assessment & Plan Note (Signed)
[name] cardiac status is stable. She has had some supraventricular arrhythmias in the past. She has never had any dangerous arrhythmias. She has normal left ventricular function. No further workup is needed. She is cleared for possible knee surgery if she decides to proceed.

## 2014-08-25 NOTE — Assessment & Plan Note (Signed)
We know that the patient has normal wall motion and normal ejection fraction by echo in 2014.

## 2014-08-25 NOTE — Assessment & Plan Note (Signed)
There is a history in the past of some type of supraventricular tachycardia. However we have not documented any definite arrhythmias since before I saw her in 2014. I feel she does not need to wear a monitor at this time.

## 2014-08-25 NOTE — Patient Instructions (Signed)
Medication Instructions:  Your physician recommends that you continue on your current medications as directed. Please refer to the Current Medication list given to you today.    Labwork: NONE  Testing/Procedures: NONE  Follow-Up: Your physician wants you to follow-up in: 2 YR FOLLOW UP You will receive a reminder letter in the mail two months in advance. If you don't receive a letter, please call our office to schedule the follow-up appointment.   Any Other Special Instructions Will Be Listed Below (If Applicable).

## 2014-08-25 NOTE — Addendum Note (Signed)
Addended by: Tarri FullerFIATO, Burris Matherne M on: 08/25/2014 01:11 PM   Modules accepted: Level of Service

## 2014-08-25 NOTE — Assessment & Plan Note (Signed)
The patient has a history in the past of symptomatic sinus tachycardia. In the past beta-blockade help with this. However she does not tolerate beta blockers well. Her heart rate is controlled at this time. No further workup.

## 2015-04-14 ENCOUNTER — Encounter (HOSPITAL_COMMUNITY): Payer: Self-pay

## 2015-04-14 ENCOUNTER — Emergency Department (HOSPITAL_COMMUNITY)
Admission: EM | Admit: 2015-04-14 | Discharge: 2015-04-15 | Disposition: A | Discharge status: Other Acute Inpatient Hospital | Payer: Medicaid Other | Attending: Emergency Medicine | Admitting: Emergency Medicine

## 2015-04-14 ENCOUNTER — Ambulatory Visit (HOSPITAL_COMMUNITY)
Admission: RE | Admit: 2015-04-14 | Discharge: 2015-04-14 | Disposition: A | Discharge status: Home / Self Care | Payer: Medicaid Other | Attending: Psychiatry | Admitting: Psychiatry

## 2015-04-14 DIAGNOSIS — F1210 Cannabis abuse, uncomplicated: Secondary | ICD-10-CM | POA: Insufficient documentation

## 2015-04-14 DIAGNOSIS — K219 Gastro-esophageal reflux disease without esophagitis: Secondary | ICD-10-CM | POA: Diagnosis not present

## 2015-04-14 DIAGNOSIS — F1010 Alcohol abuse, uncomplicated: Secondary | ICD-10-CM | POA: Diagnosis not present

## 2015-04-14 DIAGNOSIS — F419 Anxiety disorder, unspecified: Secondary | ICD-10-CM | POA: Insufficient documentation

## 2015-04-14 DIAGNOSIS — F32A Depression, unspecified: Secondary | ICD-10-CM

## 2015-04-14 DIAGNOSIS — Z880 Allergy status to penicillin: Secondary | ICD-10-CM | POA: Insufficient documentation

## 2015-04-14 DIAGNOSIS — F333 Major depressive disorder, recurrent, severe with psychotic symptoms: Secondary | ICD-10-CM | POA: Insufficient documentation

## 2015-04-14 DIAGNOSIS — I471 Supraventricular tachycardia: Secondary | ICD-10-CM | POA: Insufficient documentation

## 2015-04-14 DIAGNOSIS — F3130 Bipolar disorder, current episode depressed, mild or moderate severity, unspecified: Secondary | ICD-10-CM | POA: Insufficient documentation

## 2015-04-14 DIAGNOSIS — F17210 Nicotine dependence, cigarettes, uncomplicated: Secondary | ICD-10-CM | POA: Insufficient documentation

## 2015-04-14 DIAGNOSIS — F1310 Sedative, hypnotic or anxiolytic abuse, uncomplicated: Secondary | ICD-10-CM | POA: Insufficient documentation

## 2015-04-14 DIAGNOSIS — F329 Major depressive disorder, single episode, unspecified: Secondary | ICD-10-CM

## 2015-04-14 DIAGNOSIS — F319 Bipolar disorder, unspecified: Secondary | ICD-10-CM | POA: Diagnosis not present

## 2015-04-14 DIAGNOSIS — F1110 Opioid abuse, uncomplicated: Secondary | ICD-10-CM | POA: Diagnosis not present

## 2015-04-14 DIAGNOSIS — Z79899 Other long term (current) drug therapy: Secondary | ICD-10-CM | POA: Diagnosis not present

## 2015-04-14 DIAGNOSIS — F1120 Opioid dependence, uncomplicated: Secondary | ICD-10-CM | POA: Diagnosis present

## 2015-04-14 DIAGNOSIS — G43909 Migraine, unspecified, not intractable, without status migrainosus: Secondary | ICD-10-CM | POA: Diagnosis not present

## 2015-04-14 LAB — CBC WITH DIFFERENTIAL/PLATELET
Basophils Absolute: 0 10*3/uL (ref 0.0–0.1)
Basophils Relative: 0 %
Eosinophils Absolute: 0.1 10*3/uL (ref 0.0–0.7)
Eosinophils Relative: 2 %
HEMATOCRIT: 41.4 % (ref 36.0–46.0)
HEMOGLOBIN: 13.7 g/dL (ref 12.0–15.0)
LYMPHS ABS: 0.8 10*3/uL (ref 0.7–4.0)
LYMPHS PCT: 9 %
MCH: 27.9 pg (ref 26.0–34.0)
MCHC: 33.1 g/dL (ref 30.0–36.0)
MCV: 84.3 fL (ref 78.0–100.0)
MONOS PCT: 3 %
Monocytes Absolute: 0.3 10*3/uL (ref 0.1–1.0)
NEUTROS ABS: 7.2 10*3/uL (ref 1.7–7.7)
NEUTROS PCT: 86 %
Platelets: 199 10*3/uL (ref 150–400)
RBC: 4.91 MIL/uL (ref 3.87–5.11)
RDW: 13.6 % (ref 11.5–15.5)
WBC: 8.4 10*3/uL (ref 4.0–10.5)

## 2015-04-14 LAB — RAPID URINE DRUG SCREEN, HOSP PERFORMED
AMPHETAMINES: NOT DETECTED
BARBITURATES: NOT DETECTED
Benzodiazepines: POSITIVE — AB
Cocaine: NOT DETECTED
OPIATES: POSITIVE — AB
TETRAHYDROCANNABINOL: POSITIVE — AB

## 2015-04-14 LAB — COMPREHENSIVE METABOLIC PANEL
ALBUMIN: 4.5 g/dL (ref 3.5–5.0)
ALK PHOS: 47 U/L (ref 38–126)
ALT: 11 U/L — ABNORMAL LOW (ref 14–54)
ANION GAP: 9 (ref 5–15)
AST: 15 U/L (ref 15–41)
BUN: 6 mg/dL (ref 6–20)
CALCIUM: 9.4 mg/dL (ref 8.9–10.3)
CHLORIDE: 106 mmol/L (ref 101–111)
CO2: 25 mmol/L (ref 22–32)
Creatinine, Ser: 0.8 mg/dL (ref 0.44–1.00)
GFR calc non Af Amer: >60 mL/min (ref >60)
GLUCOSE: 104 mg/dL — AB (ref 65–99)
POTASSIUM: 3.6 mmol/L (ref 3.5–5.1)
SODIUM: 140 mmol/L (ref 135–145)
Total Bilirubin: 0.4 mg/dL (ref 0.3–1.2)
Total Protein: 7.2 g/dL (ref 6.5–8.1)

## 2015-04-14 LAB — ETHANOL: Alcohol, Ethyl (B): <5 mg/dL (ref <5)

## 2015-04-14 MED ORDER — DIAZEPAM 5 MG PO TABS
10.0000 mg | ORAL_TABLET | Freq: Three times a day (TID) | ORAL | Status: DC
  Administered 2015-04-14 – 2015-04-15 (×2): 10 mg via ORAL
  Filled 2015-04-14 (×3): qty 2

## 2015-04-14 MED ORDER — DILTIAZEM HCL ER 240 MG PO CP24
240.0000 mg | ORAL_CAPSULE | Freq: Every day | ORAL | Status: DC
Start: 2015-04-14 — End: 2015-04-15
  Administered 2015-04-15: 240 mg via ORAL
  Filled 2015-04-14 (×2): qty 1

## 2015-04-14 MED ORDER — DIVALPROEX SODIUM 500 MG PO DR TAB
1500.0000 mg | DELAYED_RELEASE_TABLET | Freq: Every day | ORAL | Status: DC
  Administered 2015-04-14: 1500 mg via ORAL
  Filled 2015-04-14: qty 3

## 2015-04-14 MED ORDER — PANTOPRAZOLE SODIUM 40 MG PO TBEC
40.0000 mg | DELAYED_RELEASE_TABLET | Freq: Every day | ORAL | Status: DC
  Administered 2015-04-15: 40 mg via ORAL
  Filled 2015-04-14 (×2): qty 1

## 2015-04-14 NOTE — ED Notes (Signed)
Patient has been wanded by security.  

## 2015-04-14 NOTE — ED Notes (Signed)
Patient belongings consist of jeans, black belt, black shirt, black sweatshirt, earrings, and black and white camo watch.

## 2015-04-14 NOTE — ED Provider Notes (Signed)
CSN: [id number]     Arrival date & time 04/14/15  1818 History   First MD Initiated Contact with Patient 04/14/15 1908     Chief Complaint  Patient presents with  . Depression   HPI The patient has had trouble with depression for several months. Her primary doctor has been treating her without success. She was reassessed by her primary care doctor today and had a formalized depression risk assessment. The patient scored very high on that assessment and she was told to go directly to [facility] behavioral health.  The patient was assessed at [facility] behavioral health today and they recommended inpatient admission. Patient was transferred to the was a long emergency department because there were no beds available. She denies any specific suicidal ideation or plan at this point. Past Medical History  Diagnosis Date  . SVT (supraventricular tachycardia) (HCC)   . Bipolar disorder (HCC)   . Depression   . Migraines   . GERD (gastroesophageal reflux disease)   . Ejection fraction   . Tobacco abuse   . Palpitations    Past Surgical History  Procedure Laterality Date  . Cholecystectomy    . Tubal ligation    . Abdominal hysterectomy     Family History  Problem Relation Age of Onset  . Heart disease Father   . Heart disease Other    Social History  Substance Use Topics  . Smoking status: Current Every Day Smoker -- 1.00 packs/day for 10 years    Types: Cigarettes  . Smokeless tobacco: Never Used  . Alcohol Use: 0.0 oz/week    0 Standard drinks or equivalent per week     Comment: occasionally   OB History    No data available     Review of Systems  All other systems reviewed and are negative.     Allergies  Amoxicillin  Home Medications   Prior to Admission medications   Medication Sig Start Date End Date Taking? Authorizing Provider  diazepam (VALIUM) 5 MG tablet Take 5 mg by mouth 3 (three) times daily as needed for anxiety.   Yes Historical Provider, MD  diltiazem  (DILACOR XR) 240 MG 24 hr capsule Take 1 capsule (240 mg total) by mouth daily. 01/12/13  Yes Luis Abed, MD  divalproex (DEPAKOTE ER) 500 MG 24 hr tablet Take 500 mg by mouth 2 (two) times daily.   Yes Historical Provider, MD  omeprazole (PRILOSEC) 20 MG capsule Take 20 mg by mouth daily.     Yes Historical Provider, MD   BP 110/63 mmHg  Pulse 78  Temp(Src) 98.7 F (37.1 C) (Oral)  SpO2 100%  LMP  (LMP Unknown) Physical Exam  Constitutional: She appears well-developed and well-nourished. No distress.  HENT:  Head: Normocephalic and atraumatic.  Right Ear: External ear normal.  Left Ear: External ear normal.  Eyes: Conjunctivae are normal. Right eye exhibits no discharge. Left eye exhibits no discharge. No scleral icterus.  Neck: Neck supple. No tracheal deviation present.  Cardiovascular: Normal rate, regular rhythm and intact distal pulses.   Pulmonary/Chest: Effort normal and breath sounds normal. No stridor. No respiratory distress. She has no wheezes. She has no rales.  Abdominal: Soft. Bowel sounds are normal. She exhibits no distension. There is no tenderness. There is no rebound and no guarding.  Musculoskeletal: She exhibits no edema or tenderness.  Neurological: She is alert. She has normal strength. No cranial nerve deficit (no facial droop, extraocular movements intact, no slurred speech) or  sensory deficit. She exhibits normal muscle tone. She displays no seizure activity. Coordination normal.  Skin: Skin is warm and dry. No rash noted.  Psychiatric: Her speech is not rapid and/or pressured, not delayed and not tangential. She is not withdrawn. She exhibits a depressed mood. She expresses no suicidal plans.  Nursing note and vitals reviewed.   ED Course  Procedures (including critical care time) Labs Review Labs Reviewed  COMPREHENSIVE METABOLIC PANEL - Abnormal; Notable for the following:    Glucose, Bld 104 (*)    ALT 11 (*)    All other components within normal  limits  URINE RAPID DRUG SCREEN, HOSP PERFORMED - Abnormal; Notable for the following:    Opiates POSITIVE (*)    Benzodiazepines POSITIVE (*)    Tetrahydrocannabinol POSITIVE (*)    All other components within normal limits  ETHANOL  CBC WITH DIFFERENTIAL/PLATELET  POC URINE PREG, ED    Medications  diazepam (VALIUM) tablet 10 mg (not administered)  diltiazem (DILACOR XR) 24 hr capsule 240 mg (not administered)  divalproex (DEPAKOTE) DR tablet 1,500 mg (not administered)  pantoprazole (PROTONIX) EC tablet 40 mg (not administered)     MDM   Final diagnoses:  Depression    Pt is medically stable.  Sent here to hold until there is a bed available at [facility].  Plan on inpatient psychiatric admission.   Linwood Dibbles, MD 04/14/15 2156

## 2015-04-14 NOTE — ED Notes (Signed)
Pt sent here from [facility].  Was told to go there by her MD.  Pt has depression.  Denies SI/HI or hallucinations. Pt does have bipolar.  Pt is on depakote.  Has missed some doses.

## 2015-04-14 NOTE — BH Assessment (Addendum)
Assessment Note  Gwendolyn [name] is an 35 y.o. female who presents voluntarily to [facility] as a walk-in under express orders from her PCP, Dr. Donzetta Sprung. Pt shared that she completed a depression screening and apparently scored so high that she was not able to leave the office until her husband picked her up and agreed to bring her to [facility]. Pt provided counselor with paperwork from the office, indicating the command to go straight to [facility], as well as a list of all the medications that pt has tried and/or are currently on to address her depression and anxiety. Pt states that she is "majorly depressed". She indicates that she's been like this for a couple of months. Pt reports that she lost 25 lbs in a couple of months due to not eating. PT indicates that she is unable to eat, due to her depression. She reports that she will drink a Boost or Ensure in the AM to take her medications and will just drink Gatorade or Powerade the rest of the day. Pt was accompanied by her husband, Genevie Cheshire, who verified all that pt was saying. Pt endorses SI with no plan. She denies HI/AVH. Pt heavily endorses anhedonia with demonstrated vegetative symptoms, indicating that all she wants to do is sit on the couch.   Case consulted with Dr. Dub Mikes. Based on pt being unsuccessful on multiple psych meds prescribed by her PCP, her excessive weight loss due to her admitted aversion to eating, and her endorsement of SI, pt meets criteria for IP admission.   Diagnosis: MDD, recurrent episode, severe; Generalized Anxiety D/O, by hx  Past Medical History:  Past Medical History  Diagnosis Date  . SVT (supraventricular tachycardia)   . Bipolar disorder   . Depression   . Migraines   . GERD (gastroesophageal reflux disease)   . Ejection fraction   . Tobacco abuse   . Palpitations     Past Surgical History  Procedure Laterality Date  . Cholecystectomy    . Tubal ligation      Family History:  Family History  Problem Relation Age of  Onset  . Heart disease Father   . Heart disease Other     Social History:  reports that she has been smoking Cigarettes.  She has a 10 pack-year smoking history. She has never used smokeless tobacco. She reports that she drinks alcohol. She reports that she uses illicit drugs (Marijuana).  Additional Social History:  Alcohol / Drug Use Pain Medications: none noted Prescriptions: Diltizam ;  Valium  3xs/day; Prilosec 20 mg Over the Counter: none noted History of alcohol / drug use?: No history of alcohol / drug abuse  CIWA:   COWS:    Allergies:  Allergies  Allergen Reactions  . Amoxicillin Shortness Of Breath, Swelling and Rash    Home Medications:  (Not in a hospital admission)  OB/GYN Status:  No LMP recorded (lmp unknown).  General Assessment Data Location of Assessment: [facility] Services (walk-in) TTS Assessment: In system Is this a Tele or Face-to-Face Assessment?: Face-to-Face Is this an Initial Assessment or a Re-assessment for this encounter?: Initial Assessment Marital status: Married Is patient pregnant?: No Pregnancy Status: No Living Arrangements: Spouse/significant other, Children Can pt return to current living arrangement?: Yes Admission Status: Voluntary Is patient capable of signing voluntary admission?: Yes Referral Source: Self/Family/Friend Insurance type: Medicaid  Medical Screening Exam [facility] Walk-in ONLY) Medical Exam completed: No Reason for MSE not completed: Other: (pt transported to [facility] for medical clearance)  Crisis Care Plan Living Arrangements: Spouse/significant other, Children Name of Psychiatrist: none Name of Therapist: none  Education Status Is patient currently in school?: No  Risk to self with the past 6 months Suicidal Ideation: Yes-Currently Present Has patient been a risk to self within the past 6 months prior to admission? : Yes Suicidal Intent: No-Not Currently/Within Last 6 Months Has patient had any  suicidal intent within the past 6 months prior to admission? : No Is patient at risk for suicide?: Yes Suicidal Plan?: No Has patient had any suicidal plan within the past 6 months prior to admission? : No Access to Means: No What has been your use of drugs/alcohol within the last 12 months?: pt denies Previous Attempts/Gestures: Yes How many times?: 2 Other Self Harm Risks: patient not eating Triggers for Past Attempts: Unknown Intentional Self Injurious Behavior: None Family Suicide History: Unknown Recent stressful life event(s): Loss (Comment), Legal Issues (loss of several family members; son in legal trouble) Persecutory voices/beliefs?: No Depression: Yes Depression Symptoms: Insomnia, Isolating, Loss of interest in usual pleasures, Feeling worthless/self pity Substance abuse history and/or treatment for substance abuse?: No Suicide prevention information given to non-admitted patients: Not applicable  Risk to Others within the past 6 months Homicidal Ideation: No Does patient have any lifetime risk of violence toward others beyond the six months prior to admission? : No Thoughts of Harm to Others: No Current Homicidal Intent: No Current Homicidal Plan: No Access to Homicidal Means: No History of harm to others?: No Assessment of Violence: In distant past Violent Behavior Description: none noted currently Does patient have access to weapons?: No Criminal Charges Pending?: No Does patient have a court date: Yes Court Date: 05/02/15 Is patient on probation?: No  Psychosis Hallucinations: None noted Delusions: None noted  Mental Status Report Appearance/Hygiene: Unremarkable Eye Contact: Good Motor Activity: Unremarkable Speech: Logical/coherent Level of Consciousness: Alert Mood: Apathetic, Anhedonia Affect: Apathetic, Blunted, Appropriate to circumstance Anxiety Level: Minimal Thought Processes: Coherent, Relevant Judgement: Partial Orientation: Person, Place,  Time, Situation Obsessive Compulsive Thoughts/Behaviors: None  Cognitive Functioning Concentration: Normal Memory: Recent Intact, Remote Intact IQ: Average Insight: Fair Impulse Control: Fair Appetite: Poor Weight Loss: 25 Weight Gain: 0 Sleep: Unable to Assess Total Hours of Sleep:  (varies from one extreme to the next) Vegetative Symptoms: Staying in bed, Decreased grooming     Prior Inpatient Therapy Prior Inpatient Therapy: Yes Prior Therapy Dates: @12  years ago Prior Therapy Facilty/Provider(s): [facility] Reason for Treatment: Suicide attempt  Prior Outpatient Therapy Prior Outpatient Therapy: No Does patient have an ACCT team?: No Does patient have Intensive In-House Services?  : No Does patient have [facility] services? : No Does patient have P4CC services?: No  ADL Screening (condition at time of admission) Is the patient deaf or have difficulty hearing?: No Does the patient have difficulty seeing, even when wearing glasses/contacts?: No Does the patient have difficulty concentrating, remembering, or making decisions?: No Does the patient have difficulty dressing or bathing?: No Does the patient have difficulty walking or climbing stairs?: No Weakness of Legs: None Weakness of Arms/Hands: None  Home Assistive Devices/Equipment Home Assistive Devices/Equipment: None  Therapy Consults (therapy consults require a physician order) PT Evaluation Needed: No OT Evalulation Needed: No SLP Evaluation Needed: No Abuse/Neglect Assessment (Assessment to be complete while patient is alone) Physical Abuse: Denies Verbal Abuse: Denies Sexual Abuse: Denies Exploitation of patient/patient's resources: Denies Self-Neglect: Denies Values / Beliefs Cultural Requests During Hospitalization: None Spiritual Requests During Hospitalization: None Consults Spiritual Care  Consult Needed: No Social Work Consult Needed: No Merchant navy officer (For Healthcare) Does patient have an advance  directive?: No Would patient like information on creating an advanced directive?: No - patient declined information    Additional Information 1:1 In Past 12 Months?: No CIRT Risk: No Elopement Risk: No Does patient have medical clearance?: No     Disposition:  Disposition Initial Assessment Completed for this Encounter: Yes Disposition of Patient: Inpatient treatment program (consulted w/ Dr. Dub Mikes) Type of inpatient treatment program: Adult (pending [facility] placement in AM)  On Site Evaluation by:   Reviewed with Physician:    Laddie Aquas 04/14/2015 6:21 PM

## 2015-04-14 NOTE — ED Notes (Signed)
Sent patient to restroom for sample.  Patient advised that took diltiazem this morning.

## 2015-04-14 NOTE — ED Notes (Signed)
Report called patient to 34

## 2015-04-15 ENCOUNTER — Encounter (HOSPITAL_COMMUNITY): Payer: Self-pay

## 2015-04-15 ENCOUNTER — Inpatient Hospital Stay (HOSPITAL_COMMUNITY)
Admission: AD | Admit: 2015-04-15 | Discharge: 2015-04-21 | DRG: 885 | Disposition: A | Discharge status: Home / Self Care | Payer: Medicaid Other | Source: Intra-hospital | Attending: Psychiatry | Admitting: Psychiatry

## 2015-04-15 DIAGNOSIS — F1120 Opioid dependence, uncomplicated: Secondary | ICD-10-CM | POA: Diagnosis present

## 2015-04-15 DIAGNOSIS — F314 Bipolar disorder, current episode depressed, severe, without psychotic features: Principal | ICD-10-CM | POA: Diagnosis present

## 2015-04-15 DIAGNOSIS — F3130 Bipolar disorder, current episode depressed, mild or moderate severity, unspecified: Secondary | ICD-10-CM

## 2015-04-15 DIAGNOSIS — F332 Major depressive disorder, recurrent severe without psychotic features: Secondary | ICD-10-CM | POA: Diagnosis present

## 2015-04-15 DIAGNOSIS — F319 Bipolar disorder, unspecified: Secondary | ICD-10-CM | POA: Diagnosis present

## 2015-04-15 DIAGNOSIS — F1210 Cannabis abuse, uncomplicated: Secondary | ICD-10-CM | POA: Insufficient documentation

## 2015-04-15 MED ORDER — DIAZEPAM 5 MG PO TABS: 5.0000 mg | ORAL_TABLET | Freq: Three times a day (TID) | ORAL | Status: DC

## 2015-04-15 MED ORDER — MIRTAZAPINE 30 MG PO TABS: 15.0000 mg | ORAL_TABLET | Freq: Every day | ORAL | Status: DC

## 2015-04-15 MED ORDER — DIAZEPAM 5 MG PO TABS
5.0000 mg | ORAL_TABLET | Freq: Three times a day (TID) | ORAL | Status: DC
  Administered 2015-04-15 – 2015-04-21 (×16): 5 mg via ORAL
  Filled 2015-04-15 (×16): qty 1

## 2015-04-15 MED ORDER — TRAZODONE HCL 50 MG PO TABS: 50.0000 mg | ORAL_TABLET | Freq: Every evening | ORAL | Status: DC | PRN

## 2015-04-15 MED ORDER — DILTIAZEM HCL ER 240 MG PO CP24
240.0000 mg | ORAL_CAPSULE | Freq: Every day | ORAL | Status: DC
  Administered 2015-04-16 – 2015-04-21 (×6): 240 mg via ORAL
  Filled 2015-04-15 (×8): qty 1

## 2015-04-15 MED ORDER — TRAZODONE HCL 50 MG PO TABS
50.0000 mg | ORAL_TABLET | Freq: Every evening | ORAL | Status: DC | PRN
  Administered 2015-04-15: 50 mg via ORAL
  Filled 2015-04-15 (×2): qty 1

## 2015-04-15 MED ORDER — PANTOPRAZOLE SODIUM 40 MG PO TBEC
40.0000 mg | DELAYED_RELEASE_TABLET | Freq: Every day | ORAL | Status: DC
  Administered 2015-04-16 – 2015-04-21 (×6): 40 mg via ORAL
  Filled 2015-04-15 (×8): qty 1

## 2015-04-15 MED ORDER — CARBAMAZEPINE ER 200 MG PO CP12
200.0000 mg | ORAL_CAPSULE | Freq: Two times a day (BID) | ORAL | Status: DC
Start: 2015-04-15 — End: 2015-04-15
  Administered 2015-04-15: 200 mg via ORAL
  Filled 2015-04-15 (×2): qty 1

## 2015-04-15 MED ORDER — NICOTINE 14 MG/24HR TD PT24
14.0000 mg | MEDICATED_PATCH | Freq: Every day | TRANSDERMAL | Status: DC
Start: 2015-04-16 — End: 2015-04-17
  Administered 2015-04-16: 14 mg via TRANSDERMAL
  Filled 2015-04-15 (×3): qty 1

## 2015-04-15 MED ORDER — CARBAMAZEPINE ER 200 MG PO CP12
200.0000 mg | ORAL_CAPSULE | Freq: Two times a day (BID) | ORAL | Status: DC
Start: 2015-04-16 — End: 2015-04-21
  Administered 2015-04-16 – 2015-04-21 (×11): 200 mg via ORAL
  Filled 2015-04-15 (×15): qty 1

## 2015-04-15 MED ORDER — MIRTAZAPINE 15 MG PO TABS
15.0000 mg | ORAL_TABLET | Freq: Every day | ORAL | Status: DC
  Administered 2015-04-15 – 2015-04-16 (×2): 15 mg via ORAL
  Filled 2015-04-15 (×5): qty 1

## 2015-04-15 MED ORDER — NICOTINE 7 MG/24HR TD PT24
7.0000 mg | MEDICATED_PATCH | Freq: Every day | TRANSDERMAL | Status: DC
  Filled 2015-04-15 (×2): qty 1

## 2015-04-15 MED ORDER — ENSURE ENLIVE PO LIQD
237.0000 mL | Freq: Two times a day (BID) | ORAL | Status: DC
  Administered 2015-04-16 – 2015-04-21 (×9): 237 mL via ORAL

## 2015-04-15 MED ORDER — ACETAMINOPHEN 325 MG PO TABS
650.0000 mg | ORAL_TABLET | Freq: Four times a day (QID) | ORAL | Status: DC | PRN
  Administered 2015-04-18 – 2015-04-20 (×2): 650 mg via ORAL
  Filled 2015-04-15 (×2): qty 2

## 2015-04-15 NOTE — Progress Notes (Signed)
Pt did not attend evening AA group.  

## 2015-04-15 NOTE — BH Assessment (Signed)
[facility] Assessment Progress Note  Per Thedore Mins, MD, this pt requires psychiatric hospitalization at this time.  Berneice Heinrich, RN, [facility] has assigned pt to [facility] Rm 302-2.  Pt has signed Voluntary Admission and Consent for Treatment, as well as Consent to Release Information to her PCP, and signed forms have been faxed to [facility].  Pt's nurse, Kendal Hymen, has been notified, and agrees to send original paperwork along with pt via Juel Burrow, and to call report to [phone].  Doylene Canning, MA Triage Specialist [phone]

## 2015-04-15 NOTE — ED Notes (Signed)
Pt discharged ambulatory with [place] driver.  All belongings were returned to pt. 

## 2015-04-15 NOTE — Plan of Care (Signed)
Problem: Consults Goal: Anxiety Disorder Patient Education See Patient Education Module for eduction specifics.  Outcome: Progressing Nurse discusssing anxiety issues with patient  Problem: Alteration in mood; excessive anxiety as evidenced by: Goal: STG-Pt can identify coping skills to manage panic/anxiety (Patient can identify at least ____ coping skills to manage panic/anxiety attack)  Outcome: Progressing Began discussing patient's current coping skills

## 2015-04-15 NOTE — BH Assessment (Signed)
Contacted Pt's physician's office to advise that patient is being voluntarily admitted to [facility].  Physician is Dr. Donzetta Sprung at [facility].   Reather Laurence, MS, LPC, NCC

## 2015-04-15 NOTE — Progress Notes (Signed)
Patient admission from [facility] who presents with complaint of "I just don't feel like doing anything anymore"."I do only as much as I absolutely have to do to get by". Patient states this state of "being down this time I'm just not coming out of", and states she does have a diagnosis of BiPolar Disorder and takes Depakote at hs. Patient also states that she has a cardiac disorder and is on Cardizem for that which she states was diagnosed when she was 35 years old and was "due to stress" the Physician told her per patient .

## 2015-04-15 NOTE — Tx Team (Signed)
Initial Interdisciplinary Treatment Plan   PATIENT STRESSORS: taking care 5 children and husband   PATIENT STRENGTHS: Ability for insight Active sense of humor Capable of independent living   PROBLEM LIST: Problem List/Patient Goals Date to be addressed Date deferred Reason deferred Estimated date of resolution  "I want to feel like getting up off my behind and being a Mom again to my kids"      "I want to gain weight"                                                 DISCHARGE CRITERIA:  Ability to meet basic life and health needs Adequate post-discharge living arrangements Improved stabilization in mood, thinking, and/or behavior  PRELIMINARY DISCHARGE PLAN: Attend aftercare/continuing care group Participate in family therapy Return to previous living arrangement  PATIENT/FAMIILY INVOLVEMENT: This treatment plan has been presented to and reviewed with the patient, Gwendolyn [name]. The patient and family have been given the opportunity to ask questions and make suggestions.  Olen Cordial 04/15/2015, 6:27 PM

## 2015-04-15 NOTE — Progress Notes (Signed)
Entered in d/c instructionsEntered in d/c instructions  Richardean Chimera This is your assigned Medicaid [place] access doctor If you prefer another contact DSS [phone] DSS assigned your doctor *You may receive a bill if you go to any family Dr not assigned to you [address] [place] [address] [phone] Medicaid [place] Access Covered Patient [place] DSS [address] [place] [phone] [email or web address] Use this website to assist with understanding your coverage & to renew application As a Medicaid client you MUST contact DSS/SSI each time you change address, move to another [place] county or another state to keep your address updated  [place] Medicaid Transportation to Dr appts if you are have full Medicaid: call your local DSS

## 2015-04-15 NOTE — Consult Note (Signed)
[facility] Consult   Reason for Consult:  Psychiatric Evaluation Referring Physician:  EDP Patient Identification: Gwendolyn [name] MRN:  [id number] Principal Diagnosis: Bipolar disorder with depression [facility]) Diagnosis:   Patient Active Problem List   Diagnosis Date Noted  . Opioid use disorder, moderate, dependence ([place]) [F11.20] 04/15/2015  . Bipolar disorder with depression ([place]) [F31.30] 04/15/2015  . Pre-operative cardiovascular examination [Z01.810] 08/25/2014  . H/O sinus tachycardia [Z86.79] 08/25/2014  . Tobacco abuse [Z72.0]   . Palpitations [R00.2]   . SVT (supraventricular tachycardia) ([place]) [I47.1]   . Bipolar disorder ([place]) [F31.9]   . Depression [F32.9]   . Migraines [G43.909]   . GERD (gastroesophageal reflux disease) [K21.9]   . Ejection fraction [R09.89]     Total Time spent with patient: 45 minutes  Subjective:   Gwendolyn [name] is a 35 y.o. female patient who states "I had a doctor's appointment yesterday and my assessment that I filled out was high."  HPI:  Gwendolyn [name] is a married 35 year old Caucasian female who presented to [facility] ED voluntary for evaluation of depressive symptoms. She states she has had increasing depression for several months. She has been on Wellbutrin and Zoloft in the past "but they stopped working." She has been to her PCP who started her on Depakote but she states it made her "feel drowsy and dizzy" and made it difficult to provide care to her younger children.She went to follow-up appointment with PCP yesterday and completed a depression risk assessment. The patient stated "my scores were so high that they handed me a piece of paper that said go directly to [facility]." She received an assessment there and was sent to [facility] ED for medical clearance.   Today, she rates her depression "10" on 0-10 scale. She states she has been depressed for several months. Today, she endorses sadness, feeling  hopeless, anxiety, decreased appetite with 25 pound weight loss over the past 2 months.  She reports passive SI. She denies homicidal ideation, intent or plan. She denies AVH.   Past Psychiatric History: Depression, Bipolar, Anxiety  Risk to Self: Is patient at risk for suicide?: No Risk to Others:   Prior Inpatient Therapy:   Prior Outpatient Therapy:    Past Medical History:  Past Medical History  Diagnosis Date  . SVT (supraventricular tachycardia) ([place])   . Bipolar disorder ([place])   . Depression   . Migraines   . GERD (gastroesophageal reflux disease)   . Ejection fraction   . Tobacco abuse   . Palpitations     Past Surgical History  Procedure Laterality Date  . Cholecystectomy    . Tubal ligation    . Abdominal hysterectomy     Family History:  Family History  Problem Relation Age of Onset  . Heart disease Father   . Heart disease Other    Family Psychiatric  History: unknown  Social History:  History  Alcohol Use  . 0.0 oz/week  . 0 Standard drinks or equivalent per week    Comment: occasionally     History  Drug Use  . Yes  . Special: Marijuana    Social History   Social History  . Marital Status: Married    Spouse Name: N/A  . Number of Children: N/A  . Years of Education: N/A   Occupational History  . disabled    Social History Main Topics  . Smoking status: Current Every Day Smoker -- 1.00 packs/day for 10  years    Types: Cigarettes  . Smokeless tobacco: Never Used  . Alcohol Use: 0.0 oz/week    0 Standard drinks or equivalent per week     Comment: occasionally  . Drug Use: Yes    Special: Marijuana  . Sexual Activity: Yes    Birth Control/ Protection: Surgical   Other Topics Concern  . None   Social History Narrative   Married   No regular exercise   Additional Social History:    Allergies:   Allergies  Allergen Reactions  . Amoxicillin Shortness Of Breath, Swelling and Rash    Has patient had a PCN reaction causing  immediate rash, facial/tongue/throat swelling, SOB or lightheadedness with hypotension: yes Has patient had a PCN reaction causing severe rash involving mucus membranes or skin necrosis: no Has patient had a PCN reaction that required hospitalization no Has patient had a PCN reaction occurring within the last 10 years: yes If all of the above answers are "NO", then may proceed with Cephalosporin use.    Labs:  Results for orders placed or performed during the hospital encounter of 04/14/15 (from the past 48 hour(s))  Urine rapid drug screen (hosp performed)not at [facility]     Status: Abnormal   Collection Time: 04/14/15  8:07 PM  Result Value Ref Range   Opiates POSITIVE (A) NONE DETECTED   Cocaine NONE DETECTED NONE DETECTED   Benzodiazepines POSITIVE (A) NONE DETECTED   Amphetamines NONE DETECTED NONE DETECTED   Tetrahydrocannabinol POSITIVE (A) NONE DETECTED   Barbiturates NONE DETECTED NONE DETECTED    Comment:        DRUG SCREEN FOR MEDICAL PURPOSES ONLY.  IF CONFIRMATION IS NEEDED FOR ANY PURPOSE, NOTIFY LAB WITHIN 5 DAYS.        LOWEST DETECTABLE LIMITS FOR URINE DRUG SCREEN Drug Class       Cutoff (ng/mL) Amphetamine      1000 Barbiturate      200 Benzodiazepine   332 Tricyclics       951 Opiates          300 Cocaine          300 THC              50   Comprehensive metabolic panel     Status: Abnormal   Collection Time: 04/14/15  8:57 PM  Result Value Ref Range   Sodium 140 135 - 145 mmol/L   Potassium 3.6 3.5 - 5.1 mmol/L   Chloride 106 101 - 111 mmol/L   CO2 25 22 - 32 mmol/L   Glucose, Bld 104 (H) 65 - 99 mg/dL   BUN 6 6 - 20 mg/dL   Creatinine, Ser 0.80 0.44 - 1.00 mg/dL   Calcium 9.4 8.9 - 10.3 mg/dL   Total Protein 7.2 6.5 - 8.1 g/dL   Albumin 4.5 3.5 - 5.0 g/dL   AST 15 15 - 41 U/L   ALT 11 (L) 14 - 54 U/L   Alkaline Phosphatase 47 38 - 126 U/L   Total Bilirubin 0.4 0.3 - 1.2 mg/dL   GFR calc non Af Amer >60 >60 mL/min   GFR calc Af Amer >60 >60 mL/min     Comment: (NOTE) The eGFR has been calculated using the CKD EPI equation. This calculation has not been validated in all clinical situations. eGFR's persistently <60 mL/min signify possible Chronic Kidney Disease.    Anion gap 9 5 - 15  CBC with Diff     Status: None  Collection Time: 04/14/15  8:57 PM  Result Value Ref Range   WBC 8.4 4.0 - 10.5 K/uL   RBC 4.91 3.87 - 5.11 MIL/uL   Hemoglobin 13.7 12.0 - 15.0 g/dL   HCT 41.4 36.0 - 46.0 %   MCV 84.3 78.0 - 100.0 fL   MCH 27.9 26.0 - 34.0 pg   MCHC 33.1 30.0 - 36.0 g/dL   RDW 13.6 11.5 - 15.5 %   Platelets 199 150 - 400 K/uL   Neutrophils Relative % 86 %   Neutro Abs 7.2 1.7 - 7.7 K/uL   Lymphocytes Relative 9 %   Lymphs Abs 0.8 0.7 - 4.0 K/uL   Monocytes Relative 3 %   Monocytes Absolute 0.3 0.1 - 1.0 K/uL   Eosinophils Relative 2 %   Eosinophils Absolute 0.1 0.0 - 0.7 K/uL   Basophils Relative 0 %   Basophils Absolute 0.0 0.0 - 0.1 K/uL  Ethanol     Status: None   Collection Time: 04/14/15  8:58 PM  Result Value Ref Range   Alcohol, Ethyl (B) <5 <5 mg/dL    Comment:        LOWEST DETECTABLE LIMIT FOR SERUM ALCOHOL IS 5 mg/dL FOR MEDICAL PURPOSES ONLY     Current Facility-Administered Medications  Medication Dose Route Frequency Provider Last Rate Last Dose  . carbamazepine (EQUETRO) 12 hr capsule 200 mg  200 mg Oral BID Jessicca Stitzer, MD      . diazepam (VALIUM) tablet 5 mg  5 mg Oral TID Corena Pilgrim, MD      . diltiazem (DILACOR XR) 24 hr capsule 240 mg  240 mg Oral Daily Dorie Rank, MD   240 mg at 04/15/15 1014  . mirtazapine (REMERON) tablet 15 mg  15 mg Oral QHS Jalasia Eskridge, MD      . pantoprazole (PROTONIX) EC tablet 40 mg  40 mg Oral Daily Dorie Rank, MD   40 mg at 04/15/15 1014  . traZODone (DESYREL) tablet 50 mg  50 mg Oral QHS PRN Corena Pilgrim, MD       Current Outpatient Prescriptions  Medication Sig Dispense Refill  . diazepam (VALIUM) 5 MG tablet Take 5 mg by mouth 3 (three) times daily  as needed for anxiety.    Marland Kitchen diltiazem (DILACOR XR) 240 MG 24 hr capsule Take 1 capsule (240 mg total) by mouth daily. 30 capsule 11  . divalproex (DEPAKOTE ER) 500 MG 24 hr tablet Take 500 mg by mouth 2 (two) times daily.    Marland Kitchen omeprazole (PRILOSEC) 20 MG capsule Take 20 mg by mouth daily.        Musculoskeletal: Strength & Muscle Tone: within normal limits Gait & Station: normal Patient leans: N/A  Psychiatric Specialty Exam: Review of Systems  Constitutional: Negative for weight loss.       Decreased appetite resulting in weight loss.  HENT: Negative.   Eyes: Negative.   Respiratory: Negative.   Cardiovascular: Negative.   Gastrointestinal: Negative.   Genitourinary: Negative.   Musculoskeletal: Negative.   Skin: Negative.   Neurological: Negative.   Endo/Heme/Allergies: Negative.   Psychiatric/Behavioral: Positive for depression. The patient is nervous/anxious.     Blood pressure 93/59, pulse 67, temperature 98.9 F (37.2 C), temperature source Oral, resp. rate 18, SpO2 100 %.There is no weight on file to calculate BMI.  General Appearance: Fairly Groomed  Engineer, water::  Fair  Speech:  Clear and Coherent and Slow  Volume:  Decreased  Mood:  Anxious and Depressed  Affect:  Congruent, Depressed and Flat  Thought Process:  Coherent  Orientation:  Full (Time, Place, and Person)  Thought Content:  WDL  Suicidal Thoughts:  Passive suicidal ideation  Homicidal Thoughts:  No  Memory:  Immediate;   Good Recent;   Fair Remote;   Fair  Judgement:  Fair  Insight:  Fair  Psychomotor Activity:  Decreased  Concentration:  Fair  Recall:  [employer] of Knowledge:Fair  Language: Fair  Akathisia:  No  Handed:  Right  AIMS (if indicated):     Assets:  Communication Skills Desire for Improvement Physical Health Resilience Social Support  ADL's:  Intact  Cognition: WNL  Sleep:      Treatment Plan Summary: Daily contact with patient to assess and evaluate symptoms and  progress in treatment  Medication management:  -Start carbamazepine 200 mg PO twice daily for mood stabilization -Diazepam 5 mg PO TID for anxiety -mirtazapine 15 mg PO QHS for depression and appetite stimulation -trazodone 50 mg PO QHS prn insomnia  Plan: Admit for inpatient stabilization  Disposition: Recommend psychiatric Inpatient admission when medically cleared.  Patient has been accepted to Dr. Sabra Heck, Rm 302-2 at [facility].  Serena Colonel, FNP-BC [place] 04/15/2015 12:14 PM Patient seen face-to-face for psychiatric evaluation, chart reviewed and case discussed with the physician extender and developed treatment plan. Reviewed the information documented and agree with the treatment plan. Corena Pilgrim, MD

## 2015-04-15 NOTE — Progress Notes (Signed)
Patient does not have a guardian at this time.

## 2015-04-15 NOTE — ED Notes (Signed)
Pt presents with complaint of depression increasing in severity for past 5-6 mos.  Pt reports she has been depressed most of her life.  Pt reports she wants to sleep most of the day and night away, but cannot because she has to take care of her 6 children.  Admits to weight loss of 30 lbs in 1 mos.  Feeling hopeless.  Denies SI, HI , states she sees floaters, no auditory hallucinations.  AAO x 3, no distress noted, calm & cooperative, interactive with staff.  Reports hx of Bipolar DO and Depression.  Monitoring for safety, Q 15 min checks in effect.

## 2015-04-15 NOTE — Plan of Care (Signed)
Problem: Consults Goal: Depression Patient Education See Patient Education Module for education specifics.  Outcome: Progressing Patient will identify ways in which depression is interfering in her daily activities  Problem: Consults Goal: Suicide Risk Patient Education (See Patient Education module for education specifics)  Outcome: Progressing Discussed SI with patient which she denies

## 2015-04-16 ENCOUNTER — Encounter (HOSPITAL_COMMUNITY): Payer: Self-pay | Admitting: Psychiatry

## 2015-04-16 DIAGNOSIS — F314 Bipolar disorder, current episode depressed, severe, without psychotic features: Principal | ICD-10-CM

## 2015-04-16 NOTE — H&P (Signed)
Psychiatric Admission Assessment Adult  Patient Identification: Gwendolyn [name] MRN:  [id number] Date of Evaluation:  04/16/2015 Chief Complaint:  MDD RECURRENT EPISODE,SEVERE GENERALIZED ANXIETY DISORDER Principal Diagnosis: <principal problem not specified> Diagnosis:   Patient Active Problem List   Diagnosis Date Noted  . Opioid use disorder, moderate, dependence ([place]) [F11.20] 04/15/2015  . Bipolar disorder with depression ([place]) [F31.30] 04/15/2015  . Bipolar 1 disorder, depressed, severe ([place]) [F31.4] 04/15/2015  . Pre-operative cardiovascular examination [Z01.810] 08/25/2014  . H/O sinus tachycardia [Z86.79] 08/25/2014  . Tobacco abuse [Z72.0]   . Palpitations [R00.2]   . SVT (supraventricular tachycardia) ([place]) [I47.1]   . Bipolar disorder ([place]) [F31.9]   . Depression [F32.9]   . Migraines [G43.909]   . GERD (gastroesophageal reflux disease) [K21.9]   . Ejection fraction [R09.89]    History of Present Illness::  Gwendolyn [name] is a married 35 year old Caucasian female who presented to [facility] ED voluntary for evaluation of depressive symptoms. She stated she has had increasing depression for several months. She has been on Wellbutrin and Zoloft in the past "but they stopped working." She has been to her PCP who started her on Depakote but she states it made her "feel drowsy and dizzy" and made it difficult to provide care to her younger children.She went to follow-up appointment with PCPand completed a depression risk assessment. The patient stated "my scores were so high that they handed me a piece of paper that said go directly to [facility]." She received an assessment there and was sent to [facility] for medical clearance.   On evaluation today still endorses feeling down, depressed but not suicidal. Says she has been loosing weight and decreaed apetite. Her uds shows marijuana and opiates. Has been feeling anxious and says she has to keep taking valium as  prescribed before. She has been started on tegretol and remeron as well for mood and depression. Mood somewhat improved since yesterday but says it could be because she is in the hospital and supportive environment.   Associated Signs/Symptoms: Depression Symptoms:  depressed mood, anhedonia, difficulty concentrating, anxiety, (Hypo) Manic Symptoms:  Distractibility, Anxiety Symptoms:  Excessive Worry, Psychotic Symptoms:  denies PTSD Symptoms: NA Total Time spent with patient: 1 hour  Past Psychiatric History: Bipolar, anxiety  Is the patient at risk to self? Yes.    Has the patient been a risk to self in the past 6 months? Yes.    Has the patient been a risk to self within the distant past? No.  Is the patient a risk to others? No.  Has the patient been a risk to others in the past 6 months? No.  Has the patient been a risk to others within the distant past? No.   Prior Inpatient Therapy:   Prior Outpatient Therapy:    Alcohol Screening: 1. How often do you have a drink containing alcohol?: Monthly or less 2. How many drinks containing alcohol do you have on a typical day when you are drinking?: 1 or 2 3. How often do you have six or more drinks on one occasion?: Never Preliminary Score: 0 9. Have you or someone else been injured as a result of your drinking?: No 10. Has a relative or friend or a doctor or another health worker been concerned about your drinking or suggested you cut down?: No Alcohol Use Disorder Identification Test Final Score (AUDIT): 1 Brief Intervention: AUDIT score less than 7 or less-screening does not suggest unhealthy drinking-brief  intervention not indicated Substance Abuse History in the last 12 months:  Yes.    marijuana Consequences of Substance Abuse: Medical Consequences:  depression Family Consequences:  conflicts Previous Psychotropic Medications: Yes  Psychological Evaluations: not known Past Medical History:  Past Medical History   Diagnosis Date  . SVT (supraventricular tachycardia) ([place])   . Bipolar disorder ([place])   . Depression   . Migraines   . GERD (gastroesophageal reflux disease)   . Ejection fraction   . Tobacco abuse   . Palpitations     Past Surgical History  Procedure Laterality Date  . Cholecystectomy    . Tubal ligation    . Abdominal hysterectomy     Family History:  Family History  Problem Relation Age of Onset  . Heart disease Father   . Heart disease Other    Family Psychiatric  History: not known Tobacco Screening: @FLOW ([id number])::1)@ Social History:  History  Alcohol Use  . 0.0 oz/week  . 0 Standard drinks or equivalent per week    Comment: occasionally     History  Drug Use  . Yes  . Special: Marijuana    Additional Social History:      History of alcohol / drug use?: No history of alcohol / drug abuse                    Allergies:   Allergies  Allergen Reactions  . Amoxicillin Shortness Of Breath, Swelling and Rash    Has patient had a PCN reaction causing immediate rash, facial/tongue/throat swelling, SOB or lightheadedness with hypotension: yes Has patient had a PCN reaction causing severe rash involving mucus membranes or skin necrosis: no Has patient had a PCN reaction that required hospitalization no Has patient had a PCN reaction occurring within the last 10 years: yes If all of the above answers are "NO", then may proceed with Cephalosporin use.   Lab Results:  Results for orders placed or performed during the hospital encounter of 04/14/15 (from the past 48 hour(s))  Urine rapid drug screen (hosp performed)not at [facility]     Status: Abnormal   Collection Time: 04/14/15  8:07 PM  Result Value Ref Range   Opiates POSITIVE (A) NONE DETECTED   Cocaine NONE DETECTED NONE DETECTED   Benzodiazepines POSITIVE (A) NONE DETECTED   Amphetamines NONE DETECTED NONE DETECTED   Tetrahydrocannabinol POSITIVE (A) NONE DETECTED   Barbiturates NONE DETECTED NONE  DETECTED    Comment:        DRUG SCREEN FOR MEDICAL PURPOSES ONLY.  IF CONFIRMATION IS NEEDED FOR ANY PURPOSE, NOTIFY LAB WITHIN 5 DAYS.        LOWEST DETECTABLE LIMITS FOR URINE DRUG SCREEN Drug Class       Cutoff (ng/mL) Amphetamine      1000 Barbiturate      200 Benzodiazepine   924 Tricyclics       268 Opiates          300 Cocaine          300 THC              50   Comprehensive metabolic panel     Status: Abnormal   Collection Time: 04/14/15  8:57 PM  Result Value Ref Range   Sodium 140 135 - 145 mmol/L   Potassium 3.6 3.5 - 5.1 mmol/L   Chloride 106 101 - 111 mmol/L   CO2 25 22 - 32 mmol/L   Glucose, Bld 104 (H) 65 -  99 mg/dL   BUN 6 6 - 20 mg/dL   Creatinine, Ser 0.80 0.44 - 1.00 mg/dL   Calcium 9.4 8.9 - 10.3 mg/dL   Total Protein 7.2 6.5 - 8.1 g/dL   Albumin 4.5 3.5 - 5.0 g/dL   AST 15 15 - 41 U/L   ALT 11 (L) 14 - 54 U/L   Alkaline Phosphatase 47 38 - 126 U/L   Total Bilirubin 0.4 0.3 - 1.2 mg/dL   GFR calc non Af Amer >60 >60 mL/min   GFR calc Af Amer >60 >60 mL/min    Comment: (NOTE) The eGFR has been calculated using the CKD EPI equation. This calculation has not been validated in all clinical situations. eGFR's persistently <60 mL/min signify possible Chronic Kidney Disease.    Anion gap 9 5 - 15  CBC with Diff     Status: None   Collection Time: 04/14/15  8:57 PM  Result Value Ref Range   WBC 8.4 4.0 - 10.5 K/uL   RBC 4.91 3.87 - 5.11 MIL/uL   Hemoglobin 13.7 12.0 - 15.0 g/dL   HCT 41.4 36.0 - 46.0 %   MCV 84.3 78.0 - 100.0 fL   MCH 27.9 26.0 - 34.0 pg   MCHC 33.1 30.0 - 36.0 g/dL   RDW 13.6 11.5 - 15.5 %   Platelets 199 150 - 400 K/uL   Neutrophils Relative % 86 %   Neutro Abs 7.2 1.7 - 7.7 K/uL   Lymphocytes Relative 9 %   Lymphs Abs 0.8 0.7 - 4.0 K/uL   Monocytes Relative 3 %   Monocytes Absolute 0.3 0.1 - 1.0 K/uL   Eosinophils Relative 2 %   Eosinophils Absolute 0.1 0.0 - 0.7 K/uL   Basophils Relative 0 %   Basophils Absolute 0.0  0.0 - 0.1 K/uL  Ethanol     Status: None   Collection Time: 04/14/15  8:58 PM  Result Value Ref Range   Alcohol, Ethyl (B) <5 <5 mg/dL    Comment:        LOWEST DETECTABLE LIMIT FOR SERUM ALCOHOL IS 5 mg/dL FOR MEDICAL PURPOSES ONLY     Blood Alcohol level:  Lab Results  Component Value Date   ETH <5 62/37/6283    Metabolic Disorder Labs:  No results found for: HGBA1C, MPG No results found for: PROLACTIN No results found for: CHOL, TRIG, HDL, CHOLHDL, VLDL, LDLCALC  Current Medications: Current Facility-Administered Medications  Medication Dose Route Frequency Provider Last Rate Last Dose  . acetaminophen (TYLENOL) tablet 650 mg  650 mg Oral Q6H PRN Lurena Nida, NP      . carbamazepine (EQUETRO) 12 hr capsule 200 mg  200 mg Oral BID Lurena Nida, NP   200 mg at 04/16/15 0858  . diazepam (VALIUM) tablet 5 mg  5 mg Oral TID Lurena Nida, NP   5 mg at 04/16/15 1253  . diltiazem (DILACOR XR) 24 hr capsule 240 mg  240 mg Oral Daily Lurena Nida, NP   240 mg at 04/16/15 0858  . feeding supplement (ENSURE ENLIVE) (ENSURE ENLIVE) liquid 237 mL  237 mL Oral BID BM Nicholaus Bloom, MD   237 mL at 04/16/15 1000  . mirtazapine (REMERON) tablet 15 mg  15 mg Oral QHS Lurena Nida, NP   15 mg at 04/15/15 2107  . nicotine (NICODERM CQ - dosed in mg/24 hours) patch 14 mg  14 mg Transdermal Daily Nicholaus Bloom, MD   [phone]  mg at 04/16/15 0800  . pantoprazole (PROTONIX) EC tablet 40 mg  40 mg Oral Daily Lurena Nida, NP   40 mg at 04/16/15 0859  . traZODone (DESYREL) tablet 50 mg  50 mg Oral QHS PRN Lurena Nida, NP   50 mg at 04/15/15 2107   PTA Medications: Prescriptions prior to admission  Medication Sig Dispense Refill Last Dose  . diazepam (VALIUM) 5 MG tablet Take 5 mg by mouth 3 (three) times daily as needed for anxiety.   Past Week at Unknown time  . diltiazem (DILACOR XR) 240 MG 24 hr capsule Take 1 capsule (240 mg total) by mouth daily. 30 capsule 11 04/14/2015 at Unknown time  .  omeprazole (PRILOSEC) 20 MG capsule Take 20 mg by mouth daily.     04/14/2015 at Unknown time    Musculoskeletal: Strength & Muscle Tone: within normal limits Gait & Station: normal Patient leans: no lean  Psychiatric Specialty Exam: Physical Exam  HENT:  Head: Normocephalic.  Skin: She is not diaphoretic.    Review of Systems  Constitutional: Negative for fever.  Cardiovascular: Negative for chest pain.  Skin: Negative for rash.  Psychiatric/Behavioral: Positive for depression and substance abuse. The patient is nervous/anxious.     Blood pressure 104/57, pulse 80, temperature 98.6 F (37 C), temperature source Oral, resp. rate 16, height 5' (1.524 m), weight 56.7 kg (125 lb), SpO2 100 %.Body mass index is 24.41 kg/(m^2).  General Appearance: Casual  Eye Contact::  Fair  Speech:  Normal Rate  Volume:  Normal  Mood:  Depressed and Dysphoric  Affect:  Congruent and Constricted  Thought Process:  Coherent  Orientation:  Full (Time, Place, and Person)  Thought Content:  Rumination  Suicidal Thoughts:  No  Homicidal Thoughts:  No  Memory:  Immediate;   Fair Recent;   Fair  Judgement:  Poor  Insight:  Shallow  Psychomotor Activity:  Decreased  Concentration:  Fair  Recall:  [employer] of Knowledge:Fair  Language: Fair  Akathisia:  Negative  Handed:  Right  AIMS (if indicated):     Assets:  Desire for Improvement  ADL's:  Intact  Cognition: WNL  Sleep:  Number of Hours: 6.75     Treatment Plan Summary: Daily contact with patient to assess and evaluate symptoms and progress in treatment, Medication management and Plan as follows  Mood disorder/ Bipolar; continue tegretol Depression: continue remeron Anxiety; valium as above [place] use: review denial and acceptance to abstain   Observation Level/Precautions:  15 minute checks  Laboratory:  as needed  Psychotherapy:  CBT  Medications:  See chart  Consultations:  As needed  Discharge Concerns:  Sobriety and  compliance  Estimated LOS: 5 days  Other:     I certify that inpatient services furnished can reasonably be expected to improve the patient's condition.    Merian Capron, MD 2/18/20171:17 PM
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
## 2015-04-16 NOTE — [facility] Suicide Risk Assessment (Signed)
[facility] Admission Suicide Risk Assessment   Nursing information obtained from:    Demographic factors:    Current Mental Status:    Loss Factors:    Historical Factors:    Risk Reduction Factors:     Total Time spent with patient: 1 hour Principal Problem: <principal problem not specified> Diagnosis:   Patient Active Problem List   Diagnosis Date Noted  . Opioid use disorder, moderate, dependence (HCC) [F11.20] 04/15/2015  . Bipolar disorder with depression (HCC) [F31.30] 04/15/2015  . Bipolar 1 disorder, depressed, severe (HCC) [F31.4] 04/15/2015  . Pre-operative cardiovascular examination [Z01.810] 08/25/2014  . H/O sinus tachycardia [Z86.79] 08/25/2014  . Tobacco abuse [Z72.0]   . Palpitations [R00.2]   . SVT (supraventricular tachycardia) (HCC) [I47.1]   . Bipolar disorder (HCC) [F31.9]   . Depression [F32.9]   . Migraines [G43.909]   . GERD (gastroesophageal reflux disease) [K21.9]   . Ejection fraction [R09.89]    Subjective Data: alert oriented but depressed and anxious.   Continued Clinical Symptoms:  Alcohol Use Disorder Identification Test Final Score (AUDIT): 1 The "Alcohol Use Disorders Identification Test", Guidelines for Use in Primary Care, Second Edition.  World Science writer [facility]). Score between 0-7:  no or low risk or alcohol related problems. Score between 8-15:  moderate risk of alcohol related problems. Score between 16-19:  high risk of alcohol related problems. Score 20 or above:  warrants further diagnostic evaluation for alcohol dependence and treatment.   CLINICAL FACTORS:   Depression:   Anhedonia Impulsivity Alcohol/Substance Abuse/Dependencies More than one psychiatric diagnosis   Musculoskeletal: Strength & Muscle Tone: within normal limits Gait & Station: normal Patient leans: no lean  Psychiatric Specialty Exam: Review of Systems  Constitutional: Negative for fever.  Cardiovascular: Negative for chest pain.  Skin: Negative for  rash.  Psychiatric/Behavioral: Positive for depression and substance abuse. The patient is nervous/anxious.     Blood pressure 104/57, pulse 80, temperature 98.6 F (37 C), temperature source Oral, resp. rate 16, height 5' (1.524 m), weight 56.7 kg (125 lb), SpO2 100 %.Body mass index is 24.41 kg/(m^2).  General Appearance: Casual  Eye Contact::  Fair  Speech:  Normal Rate  Volume:  Normal  Mood:  Depressed and Dysphoric  Affect:  Congruent  Thought Process:  Coherent  Orientation:  Full (Time, Place, and Person)  Thought Content:  Rumination  Suicidal Thoughts:  No  Homicidal Thoughts:  No  Memory:  Immediate;   Fair Recent;   Fair  Judgement:  Poor  Insight:  Shallow  Psychomotor Activity:  Normal  Concentration:  Fair  Recall:  [employer] of Knowledge:Fair  Language: Fair  Akathisia:  Negative  Handed:  Right  AIMS (if indicated):     Assets:  Desire for Improvement  Sleep:  Number of Hours: 6.75  Cognition: WNL  ADL's:  Intact    COGNITIVE FEATURES THAT CONTRIBUTE TO RISK:  Closed-mindedness    SUICIDE RISK:   Moderate:  Frequent suicidal ideation with limited intensity, and duration, some specificity in terms of plans, no associated intent, good self-control, limited dysphoria/symptomatology, some risk factors present, and identifiable protective factors, including available and accessible social support.  PLAN OF CARE: Admit for stabilization. Medication management and safety. CBT   I certify that inpatient services furnished can reasonably be expected to improve the patient's condition.   Thresa Ross, MD 04/16/2015, 1:24 PM

## 2015-04-16 NOTE — Progress Notes (Signed)
D: Patient alert and oriented x 4. Patient denies pain/SI/HI/AVH. Patient complained of feeling some increased anxiety. This writer advised patient to try some deep breathing. Patient reported it helped.  A: Staff to monitor Q 15 mins for safety. Encouragement and support offered. Scheduled medications administered per orders. R: Patient remains safe on the unit. Patient visible on the unit. Patient taking administered medications.

## 2015-04-16 NOTE — [facility] Group Notes (Signed)
[facility] Group Notes:  (Clinical Social Work)   12/25/2014     10:00-11:00AM  Summary of Progress/Problems:   In today's process group patients listed needs that human beings have, then listed healthy and unhealthy coping techniques, determining that unhealthy coping techniques work initially, but eventually become harmful.  There was then a discussion of various ideas of healthy coping techniques and how to go about switching from unhealthy to healthy choices.  Motivational Interviewing and the whiteboard were utilized for the exercises.  The patient expressed that the unhealthy coping she often uses is isolation to the point that she will not leave her house, as well as not eating.  This is because of a lot of loss over the last few years and the fact that she has not grieved them at all.  Type of Therapy:  Group Therapy - Process   Participation Level:  Active  Participation Quality:  Attentive and Sharing  Affect:  Blunted and Depressed  Cognitive:  Appropriate  Insight:  Engaged  Engagement in Therapy:  Engaged  Modes of Intervention:  Education, Motivational Interviewing  Ambrose Mantle, LCSW 04/16/2015, 12:17 PM

## 2015-04-16 NOTE — [facility] Group Notes (Signed)
[facility] Group Notes:  (Nursing/MHT/Case Management/Adjunct)  Date:  04/16/2015  Time:  1315  Type of Therapy:  Nurse Education  /  Life Skills : The focus of the group is to teach patients how to identify their needs as well as identify healthy coping skills...needed to get their needs met.  Participation Level:  Active  Participation Quality:  Appropriate  Affect:  Appropriate  Cognitive:  Alert  Insight:  Improving  Engagement in Group:  Engaged  Modes of Intervention:  Education  Summary of Progress/Problems:  Gwendolyn [name] 04/16/2015, 4:25 PM

## 2015-04-16 NOTE — Progress Notes (Signed)
Gwendolyn [name] has been out in the milieu today...toelrated well. Gwendolyn [name] refused her adderrall today, saying Gwendolyn [name] didn't need it. Gwendolyn [name] makes good eye contact. Gwendolyn [name] minimizes her discomfort . Gwendolyn [name] completed her daily assessment and on it Gwendolyn [name] wrote Gwendolyn [name] deneid SI and Gwendolyn [name] rated her depressiion, hopelessness and anxiety " 8/8/9", respectively. R Safety in place.

## 2015-04-16 NOTE — Progress Notes (Signed)
D Miaisabella is OOB UAL on the [address] today...she tolerates this well. She reproted tot his nurse that she is having some vaginal discharge as well as burning on urination  And this info is funneled to the NP, resulting in an order for cipro and flagyl for pt being initiated. Pt also encouraged to cont to force po fluids, especially water! A She completes her daily assessment and on it she wrote she denied SI today and she rated her depression , hopelessness and anxiety " 8/6/7", respectively .

## 2015-04-17 DIAGNOSIS — F1210 Cannabis abuse, uncomplicated: Secondary | ICD-10-CM

## 2015-04-17 MED ORDER — METRONIDAZOLE 500 MG PO TABS
500.0000 mg | ORAL_TABLET | Freq: Two times a day (BID) | ORAL | Status: DC
  Administered 2015-04-17 – 2015-04-21 (×8): 500 mg via ORAL
  Filled 2015-04-17 (×13): qty 1

## 2015-04-17 MED ORDER — MIRTAZAPINE 30 MG PO TABS
30.0000 mg | ORAL_TABLET | Freq: Every day | ORAL | Status: DC
  Administered 2015-04-18 – 2015-04-20 (×4): 30 mg via ORAL
  Filled 2015-04-17 (×6): qty 1

## 2015-04-17 MED ORDER — TRAZODONE HCL 100 MG PO TABS
100.0000 mg | ORAL_TABLET | Freq: Every evening | ORAL | Status: DC | PRN
  Filled 2015-04-17: qty 1

## 2015-04-17 MED ORDER — CIPROFLOXACIN HCL 500 MG PO TABS
500.0000 mg | ORAL_TABLET | Freq: Two times a day (BID) | ORAL | Status: AC
  Administered 2015-04-17 – 2015-04-19 (×6): 500 mg via ORAL
  Filled 2015-04-17 (×5): qty 1
  Filled 2015-04-17: qty 2
  Filled 2015-04-17: qty 1
  Filled 2015-04-17: qty 2

## 2015-04-17 NOTE — Progress Notes (Signed)
[facility] MD Progress Note  04/17/2015 11:20 AM Gwendolyn [name]  MRN:  [id number] Subjective:  Alert oriented, slept poorly. Says trazadone didn't help. Wants to adjust her medications. Still upset about her home situation and feeling of having no support.  She was positive for drugs including marijuana. remeron was started. Past history of being on zoloft, wellbutrin and depakote.  Principal Problem: <principal problem not specified> Diagnosis:   Patient Active Problem List   Diagnosis Date Noted  . Opioid use disorder, moderate, dependence (HCC) [F11.20] 04/15/2015  . Bipolar disorder with depression (HCC) [F31.30] 04/15/2015  . Bipolar 1 disorder, depressed, severe (HCC) [F31.4] 04/15/2015  . Pre-operative cardiovascular examination [Z01.810] 08/25/2014  . H/O sinus tachycardia [Z86.79] 08/25/2014  . Tobacco abuse [Z72.0]   . Palpitations [R00.2]   . SVT (supraventricular tachycardia) (HCC) [I47.1]   . Bipolar disorder (HCC) [F31.9]   . Depression [F32.9]   . Migraines [G43.909]   . GERD (gastroesophageal reflux disease) [K21.9]   . Ejection fraction [R09.89]    Total Time spent with patient: 30 minutes  Past Psychiatric History: depression.   Past Medical History:  Past Medical History  Diagnosis Date  . SVT (supraventricular tachycardia) (HCC)   . Bipolar disorder (HCC)   . Depression   . Migraines   . GERD (gastroesophageal reflux disease)   . Ejection fraction   . Tobacco abuse   . Palpitations     Past Surgical History  Procedure Laterality Date  . Cholecystectomy    . Tubal ligation    . Abdominal hysterectomy     Family History:  Family History  Problem Relation Age of Onset  . Heart disease Father   . Heart disease Other    Family Psychiatric  History: depression Social History:  History  Alcohol Use  . 0.0 oz/week  . 0 Standard drinks or equivalent per week    Comment: occasionally     History  Drug Use  . Yes  . Special: Marijuana    Social  History   Social History  . Marital Status: Married    Spouse Name: N/A  . Number of Children: N/A  . Years of Education: N/A   Occupational History  . disabled    Social History Main Topics  . Smoking status: Current Every Day Smoker -- 1.00 packs/day for 10 years    Types: Cigarettes  . Smokeless tobacco: Never Used  . Alcohol Use: 0.0 oz/week    0 Standard drinks or equivalent per week     Comment: occasionally  . Drug Use: Yes    Special: Marijuana  . Sexual Activity: Yes    Birth Control/ Protection: Surgical   Other Topics Concern  . None   Social History Narrative   Married   No regular exercise   Additional Social History:    History of alcohol / drug use?: No history of alcohol / drug abuse                    Sleep: Poor  Appetite:  Fair  Current Medications: Current Facility-Administered Medications  Medication Dose Route Frequency Provider Last Rate Last Dose  . acetaminophen (TYLENOL) tablet 650 mg  650 mg Oral Q6H PRN Kristeen Mans, NP      . carbamazepine (EQUETRO) 12 hr capsule 200 mg  200 mg Oral BID Kristeen Mans, NP   200 mg at 04/17/15 4098  . diazepam (VALIUM) tablet 5 mg  5 mg Oral TID Angelina Ok  Hobson, NP   5 mg at 04/17/15 0823  . diltiazem (DILACOR XR) 24 hr capsule 240 mg  240 mg Oral Daily Kristeen Mans, NP   240 mg at 04/17/15 1610  . feeding supplement (ENSURE ENLIVE) (ENSURE ENLIVE) liquid 237 mL  237 mL Oral BID BM Rachael Fee, MD   237 mL at 04/16/15 1500  . mirtazapine (REMERON) tablet 30 mg  30 mg Oral QHS Thresa Ross, MD      . pantoprazole (PROTONIX) EC tablet 40 mg  40 mg Oral Daily Kristeen Mans, NP   40 mg at 04/17/15 0823  . traZODone (DESYREL) tablet 100 mg  100 mg Oral QHS PRN Thresa Ross, MD        Lab Results: No results found for this or any previous visit (from the past 48 hour(s)).  Blood Alcohol level:  Lab Results  Component Value Date   ETH <5 04/14/2015    Physical Findings: AIMS: Facial and Oral  Movements Muscles of Facial Expression: None, normal Lips and Perioral Area: None, normal Jaw: None, normal Tongue: None, normal,Extremity Movements Upper (arms, wrists, hands, fingers): None, normal Lower (legs, knees, ankles, toes): None, normal, Trunk Movements Neck, shoulders, hips: None, normal, Overall Severity Severity of abnormal movements (highest score from questions above): None, normal Incapacitation due to abnormal movements: None, normal Patient's awareness of abnormal movements (rate only patient's report): No Awareness, Dental Status Current problems with teeth and/or dentures?: No Does patient usually wear dentures?: No  CIWA:  CIWA-Ar Total: 1 COWS:  COWS Total Score: 4  Musculoskeletal: Strength & Muscle Tone: within normal limits Gait & Station: normal Patient leans: no lean  Psychiatric Specialty Exam: Review of Systems  Constitutional: Negative for fever.  Cardiovascular: Negative for chest pain.  Neurological: Negative for tremors.  Psychiatric/Behavioral: Positive for depression and substance abuse. Negative for suicidal ideas. The patient is nervous/anxious and has insomnia.     Blood pressure 108/60, pulse 63, temperature 98.1 F (36.7 C), temperature source Oral, resp. rate 18, height 5' (1.524 m), weight 56.7 kg (125 lb), SpO2 100 %.Body mass index is 24.41 kg/(m^2).  General Appearance: Casual  Eye Contact::  Fair  Speech:  Clear and Coherent  Volume:  Decreased  Mood:  Depressed and Dysphoric  Affect:  Congruent  Thought Process:  Coherent  Orientation:  Full (Time, Place, and Person)  Thought Content:  Rumination  Suicidal Thoughts:  No  Homicidal Thoughts:  No  Memory:  Immediate;   Fair Recent;   Fair  Judgement:  Fair  Insight:  Shallow  Psychomotor Activity:  Normal  Concentration:  Fair  Recall:  [employer] of Knowledge:Fair  Language: Fair  Akathisia:  Negative  Handed:  Right  AIMS (if indicated):     Assets:  Desire for  Improvement  ADL's:  Intact  Cognition: WNL  Sleep:  Number of Hours: 6.5   Treatment Plan Summary: Daily contact with patient to assess and evaluate symptoms and progress in treatment, Medication management and Plan as follows  Mood disorder: on Equitro, will continue.  Sleep and depression: increase remeron <Link SnufferTEXTTAG>  qhs Also increase trazadone . Groups and CBT . Knowledge of how drugs and marijuana can effect mood and abstinence at discharge.   Thresa Ross, MD 04/17/2015, 11:20 AM

## 2015-04-17 NOTE — Progress Notes (Addendum)
Pt is alert and receptive to help in breaking out of her depressive state. pt emotional support system was centered in older relatives. These have died one by one, leaving pt, with the feeling of having no physical, mental or spiritual support or care. Pt reports that parenting seven children has and is taking a toll on the depressive downward spiral. Two children have left home and no longer are a part of pt life, but the five remaining at home seem to be the only reason pt sees for existence. Pt denies pain, but upon deeper conversation, admits using drugs to take the edge off depression. Pt taught self and was taught by others that pt is worthless, which brings pt to the standpoint of hyper depression. Pt spouse leaves for work before family awakens, and returns after family is in bed. When spouse is at home, spouse wishes to be alone, claiming PTSD from a [employer] as the reason to be alone. Isolation, self imposed and imposed by others is problematic. At 34 the pt craves a more balance life, with time to self, and more quality in relationships with spouse, children and a very few friends. This to pt is having time to self.  Spiritual Assessment:  Pt is self loathing. Pt is isolated from life as pt would wish. Pt spiritual/emotional, physical pain and mental pain migrates quickly from one to another. Pt feels unbalanced spiritually. Pt feels caught in a life with little or no meaning and residence at [facility] is a retreat to re-tool for life. Pt desires guidance, assurance and hope. Pt fears leaving [facility] with no insight or direction. Pt is a deeply spiritual person, faith groundings, although greatly covered by life experiences, is still a anchor to pt thought processes. Pt has trust issues, having her support system be depleted has caused pt to fear opening up to those who may humiliate, compromise, or endanger pt's mental and spiritual self.  Spiritual Interventions:  Pt wishes for a chaplain visit  each day. This may not be realistic, however, a visit when pt feels especially in need of spiritual direction, in concert with other treatments would be beneficial to pt health. Encouragement to find a community of faith where pt feels safe, accepted, and that is spiritually refreshing would be greatly beneficial. Pt should be allowed as frequently as need be allowed to be open and genuine with care givers, given pt deep fear of being compromised or humiliated.  Page chaplain at any time if pt needs or wishes further spiritual care.  Benjie Karvonen. Gesselle Fitzsimons, DMin, MDiv, MA, BSW Chaplain

## 2015-04-17 NOTE — Progress Notes (Signed)
D.  Pt in room on approach lying in bed.  Pt states she did not go to group tonight because she is not here for alcohol or drug abuse. Pt has just been started on Cipro and not feeling well.  Pt denies SI/HI/hallucinations at this time.  A.  Support and encouragement offered, medication given as ordered  R.  Pt remains safe on the unit, will continue to monitor.

## 2015-04-17 NOTE — Progress Notes (Signed)
Malodorous discharge.  UA/CX/Chlam/Gonorrhea urine sample ordered.  Cipro and BV po ordered

## 2015-04-17 NOTE — Progress Notes (Signed)
Gwendolyn [name] has had a better day today. She completed her daily assessment and on it she wrote she denied SI and she rated her derpession, hopelessness and axneity " 8/6/7", respectively. She attends her groups. She takes her medications as ordered and is compliant with her therapies, attending her groups as planned. A She shared that she has had a vaginal discharge along with bad smell and she is started on cipro and flagyl; and UA is sent. She spent time talking with pastoral care and has asked to see them again, while she is here, R Safety is in place.

## 2015-04-17 NOTE — Progress Notes (Signed)
Patient did not attend the evening speaker AA meeting. Pt was notified that group was beginning but remained in bed.   

## 2015-04-17 NOTE — Progress Notes (Signed)
NUTRITION ASSESSMENT  Pt identified as at risk on the Malnutrition Screen Tool  INTERVENTION: 1. Supplements: Ensure Enlive po BID, each supplement provides 350 kcal and 20 grams of protein  NUTRITION DIAGNOSIS: Unintentional weight loss related to sub-optimal intake as evidenced by pt report.   Goal: Pt to meet >/= 90% of their estimated nutrition needs.  Monitor:  PO intake  Assessment:  Pt admitted with anxiety and depression. Pt reports in H&P she has been losing weight and her appetite has decreased. Reports use of marijuana and opiates. Per weight history, pt has lost 19 lb since June 2016 (13% wt loss x 7.5 months, significant for time frame). Pt has been ordered Ensure supplements per ONS.  Height: Ht Readings from Last 1 Encounters:  04/15/15 5' (1.524 m)    Weight: Wt Readings from Last 1 Encounters:  04/15/15 125 lb (56.7 kg)    Weight Hx: Wt Readings from Last 10 Encounters:  04/15/15 125 lb (56.7 kg)  08/25/14 144 lb (65.318 kg)  01/12/13 133 lb (60.328 kg)  01/02/13 132 lb (59.875 kg)  02/22/12 123 lb (55.792 kg)  12/10/11 128 lb (58.06 kg)    BMI:  Body mass index is 24.41 kg/(m^2). Pt meets criteria for normal range based on current BMI.  Estimated Nutritional Needs: Kcal: 25-30 kcal/kg Protein: > 1 gram protein/kg Fluid: 1 ml/kcal  Diet Order: Diet regular Room service appropriate?: Yes; Fluid consistency:: Thin Pt is also offered choice of unit snacks mid-morning and mid-afternoon.  Pt is eating as desired.   Lab results and medications reviewed.   Tilda Franco, MS, RD, LDN Pager: [phone] After Hours Pager: [phone]

## 2015-04-17 NOTE — [facility] Group Notes (Signed)
[facility] Group Notes:  (Clinical Social Work)  04/17/2015  10:00-11:00AM  Summary of Progress/Problems:   The main focus of today's process group was to   1)  discuss the importance of adding supports  2)  define health supports versus unhealthy supports  3)  identify the patient's current unhealthy supports and plan how to handle them  4)  Identify the patient's current healthy supports and plan what to add.  An emphasis was placed on using counselor, doctor, therapy groups, 12-step groups, and problem-specific support groups to expand supports.    The patient expressed full comprehension of the concepts presented, and agreed that there is a need to add more supports.  The patient stated she has absolutely no healthy supports in her life right now.  She listened attentively and agreed with much that was said, but did not want to talk.  Type of Therapy:  Process Group with Motivational Interviewing  Participation Level:  Active  Participation Quality:  Attentive  Affect:  Anxious, Blunted and Depressed  Cognitive:  Appropriate  Insight:  Developing/Improving  Engagement in Therapy:  Engaged  Modes of Intervention:   Education, Support and Processing, Activity  Ambrose Mantle, LCSW 04/17/2015

## 2015-04-17 NOTE — Progress Notes (Signed)
D: Patient alert and oriented x 4. Patient denies pain/SI/HI/AVH. Patient states she has been really tired today. Patient has been in bed during shift only to get up for HS medications. Patient states she is going to make a goal for tomorrow to try and stay up during the day.  A: Staff to monitor Q 15 mins for safety. Encouragement and support offered. Scheduled medications administered per orders. R: Patient remains safe on the unit. Patient did not attended group tonight. Patient visible on the unit. Patient taking administered medications.

## 2015-04-17 NOTE — Progress Notes (Signed)
Psychoeducational Group Note  Date:  04/17/2015 Time:  2401  Group Topic/Focus:  Wrap-Up Group:   The focus of this group is to help patients review their daily goal of treatment and discuss progress on daily workbooks.  Participation Level: Did Not Attend  Participation Quality:  Not Applicable  Affect:  Not Applicable  Cognitive:  Not Applicable  Insight:  Not Applicable  Engagement in Group: Not Applicable  Additional Comments:  The patient did not attend group last evening.   Kiora Hallberg S 04/17/2015, 12:01 AM

## 2015-04-17 NOTE — [facility] Group Notes (Signed)
[facility] Group Notes:  (Nursing/MHT/Case Management/Adjunct)  Date:  04/17/2015  Time:  1315  Type of Therapy:  Nurse Education  /   Life SKills The group focuses on teaching patients how to identify their needs as well as idendify the healthy skills needed to get their needs met.  Participation Level:  Active  Participation Quality:  Drowsy  Affect:  Appropriate  Cognitive:  Alert  Insight:  Improving  Engagement in Group:  Engaged  Modes of Intervention:  Discussion  Summary of Progress/Problems:  ,  Gwendolyn [name] 04/17/2015, 4:59 PM 

## 2015-04-18 ENCOUNTER — Encounter (HOSPITAL_COMMUNITY): Payer: Self-pay | Admitting: Psychiatry

## 2015-04-18 LAB — GC/CHLAMYDIA PROBE AMP (~~LOC~~) NOT AT ARMC
Chlamydia: NEGATIVE
Neisseria Gonorrhea: NEGATIVE

## 2015-04-18 LAB — URINALYSIS, ROUTINE W REFLEX MICROSCOPIC
BILIRUBIN URINE: NEGATIVE
GLUCOSE, UA: NEGATIVE mg/dL
HGB URINE DIPSTICK: NEGATIVE
KETONES UR: NEGATIVE mg/dL
LEUKOCYTES UA: NEGATIVE
Nitrite: NEGATIVE
PH: 5.5 (ref 5.0–8.0)
PROTEIN: NEGATIVE mg/dL
Specific Gravity, Urine: 1.027 (ref 1.005–1.030)

## 2015-04-18 NOTE — Plan of Care (Signed)
Problem: Consults Goal: Anxiety Disorder Patient Education See Patient Education Module for eduction specifics.  Outcome: Progressing Nurse discussed anxiety/coping skills with patient.        

## 2015-04-18 NOTE — Tx Team (Signed)
Interdisciplinary Treatment Plan Update (Adult)  Date:  04/18/2015  Time Reviewed:  11:04 AM   Progress in Treatment: Attending groups: Yes. Participating in groups:  Yes. Taking medication as prescribed:  Yes. Tolerating medication:  Yes. Family/Significant othe contact made:  SPE required for this pt.  Patient understands diagnosis:  Yes. and As evidenced by:  seeking treatment for depression, passive SI, and for medication stabilizatio. Discussing patient identified problems/goals with staff:  Yes. Medical problems stabilized or resolved:  Yes. Denies suicidal/homicidal ideation: No. Passive Si/Able to contract for safety on the unit.  Issues/concerns per patient self-inventory:  Other:  Discharge Plan or Barriers: CSW assessing for appropriate referrals. Pt lives with her husband and has PCP. No mental health providers.   Reason for Continuation of Hospitalization: Depression Medication stabilization Suicidal ideation  Comments:  Gwendolyn [name] is an 35 y.o. female who presents voluntarily to [facility] as a walk-in under express orders from her PCP, Dr. Gar Ponto. Pt shared that she completed a depression screening and apparently scored so high that she was not able to leave the office until her husband picked her up and agreed to bring her to [facility]. Pt provided counselor with paperwork from the office, indicating the command to go straight to [facility], as well as a list of all the medications that pt has tried and/or are currently on to address her depression and anxiety. Pt states that she is "majorly depressed". She indicates that she's been like this for a couple of months. Pt reports that she lost 25 lbs in a couple of months due to not eating. PT indicates that she is unable to eat, due to her depression. She reports that she will drink a Boost or Ensure in the AM to take her medications and will just drink Gatorade or Powerade the rest of the day. Pt was accompanied by her husband, Abe People,  who verified all that pt was saying. Pt endorses SI with no plan. She denies HI/AVH. Pt heavily endorses anhedonia with demonstrated vegetative symptoms, indicating that all she wants to do is sit on the couch. Diagnosis: MDD, recurrent episode, severe; Generalized Anxiety D/O, by hx  Estimated length of stay:  3-5 days   New goal(s): to develop effective aftercare plan.   Additional Comments:  Patient and CSW reviewed pt's identified goals and treatment plan. Patient verbalized understanding and agreed to treatment plan. CSW reviewed [facility] "Discharge Process and Patient Involvement" Form. Pt verbalized understanding of information provided and signed form.    Review of initial/current patient goals per problem list:  1. Goal(s): Patient will participate in aftercare plan  Met: No.   Target date: at discharge  As evidenced by: Patient will participate within aftercare plan AEB aftercare provider and housing plan at discharge being identified.  2/20: CSW assessing for appropriate referrals at this time. PCP-Dr. Gar Ponto.   2. Goal (s): Patient will exhibit decreased depressive symptoms and suicidal ideations.  Met: No.    Target date: at discharge  As evidenced by: Patient will utilize self rating of depression at 3 or below and demonstrate decreased signs of depression or be deemed stable for discharge by MD.  2/20: Pt rates depression as high. Passive SI at times. Denies HI/AVH. Able to contract for safety on the unit.   Attendees: Patient:   04/18/2015 11:04 AM   Family:   04/18/2015 11:04 AM   Physician:  Dr. Carlton Adam, MD 04/18/2015 11:04 AM   Nursing:   Parthenia Ames RN  04/18/2015 11:04 AM   Clinical Social Worker: [place], LCSW 04/18/2015 11:04 AM   Clinical Social Worker: Erasmo Downer Drinkard LCSWA; Peri Maris LCSWA 04/18/2015 11:04 AM    04/18/2015 11:04 AM    04/18/2015 11:04 AM   Other:   04/18/2015 11:04 AM   Other:  04/18/2015 11:04 AM   Other:  04/18/2015  11:04 AM   Other:  04/18/2015 11:04 AM    04/18/2015 11:04 AM    04/18/2015 11:04 AM    04/18/2015 11:04 AM    04/18/2015 11:04 AM    Scribe for Treatment Team:   Maxie Better, LCSW 04/18/2015 11:04 AM

## 2015-04-18 NOTE — Progress Notes (Addendum)
D:  Patient's self inventory sheet, patient has fair sleep, sleep medication is helpful.  Fair appetite, normal energy level, good concentration.  Rated depression, hopeless and anxiety #4.  Denied withdrawals.  Denied SI.  Physical problems, headaches, worst pain #8 in past 24 hours.  No pain medication.  Goal is to eat more and attend groups.  Plans to eat and do groups.  Does have discharge plans. A:  Medications administered per MD orders. Emotional support and encouragement given patient. R:  Denied SI and HI, contracts for safety.  Denied A/V hallucinations.  Safety maintained with 15 minute checks.

## 2015-04-18 NOTE — [facility] Group Notes (Signed)
[facility] LCSW Group Therapy  04/18/2015 1:04 PM  Type of Therapy:  Group Therapy  Participation Level:  Active  Participation Quality:  Attentive  Affect:  Appropriate  Cognitive:  Alert and Oriented  Insight:  Improving  Engagement in Therapy:  Improving  Modes of Intervention:  Confrontation, Discussion, Education, Exploration, Problem-solving, Rapport Building, Socialization and Support  Summary of Progress/Problems: Today's Topic: Overcoming Obstacles. Patients identified one short term goal and potential obstacles in reaching this goal. Patients processed barriers involved in overcoming these obstacles. Patients identified steps necessary for overcoming these obstacles and explored motivation (internal and external) for facing these difficulties head on. Gwendolyn [name] was attentive and engaged during today's processing group. She shared that her biggest obstacle is "finding support out [place] She reports having limited access to transportation and is often isolated in her home with five children. Gwendolyn [name] shared that she is learning coping skills to manage depression here at the hospital and is making connections for increased support. She continues to show progress in the group setting with improving insight.   Smart, [place] LCSW 04/18/2015, 1:04 PM

## 2015-04-18 NOTE — Progress Notes (Signed)
Pt attended evening AA group. 

## 2015-04-18 NOTE — Progress Notes (Signed)
[facility] MD Progress Note  04/18/2015 11:32 AM Gwendolyn [name]  MRN:  [id number] Subjective:  Lots of depression. Isolating. Couple of months. States she thought things were going OK but started losing weight. Has 7 kids 5 6 7 16 18 20  25. States she is barely taking care of things. Husband goes to work early comes home late. Married since she was 72. Lots of death about 8 people. 4 aunts an uncle a cousin a Financial planner godmother grandmother within 4 years. She also describes mood fluctuations what led to a trial with Depakote. States she was seeing some benefit from the Depakote but it was too sedating affected her ability to function from day to day Principal Problem: <principal problem not specified> Diagnosis:   Patient Active Problem List   Diagnosis Date Noted  . Marijuana abuse [F12.10]   . Opioid use disorder, moderate, dependence (HCC) [F11.20] 04/15/2015  . Bipolar disorder with depression (HCC) [F31.30] 04/15/2015  . Bipolar 1 disorder, depressed, severe (HCC) [F31.4] 04/15/2015  . Pre-operative cardiovascular examination [Z01.810] 08/25/2014  . H/O sinus tachycardia [Z86.79] 08/25/2014  . Tobacco abuse [Z72.0]   . Palpitations [R00.2]   . SVT (supraventricular tachycardia) (HCC) [I47.1]   . Bipolar disorder (HCC) [F31.9]   . Depression [F32.9]   . Migraines [G43.909]   . GERD (gastroesophageal reflux disease) [K21.9]   . Ejection fraction [R09.89]    Total Time spent with patient: 20 minutes  Past Psychiatric History: [facility] X 2 tried to hurt self conflict with H. Valium Lamictal Trazodone Protonix   Past Medical History:  Past Medical History  Diagnosis Date  . SVT (supraventricular tachycardia) (HCC)   . Bipolar disorder (HCC)   . Depression   . Migraines   . GERD (gastroesophageal reflux disease)   . Ejection fraction   . Tobacco abuse   . Palpitations     Past Surgical History  Procedure Laterality Date  . Cholecystectomy    . Tubal ligation    . Abdominal  hysterectomy     Family History:  Family History  Problem Relation Age of Onset  . Heart disease Father   . Heart disease Other    Family Psychiatric  History: see admission H and P Social History:  History  Alcohol Use  . 0.0 oz/week  . 0 Standard drinks or equivalent per week    Comment: occasionally     History  Drug Use  . Yes  . Special: Marijuana    Social History   Social History  . Marital Status: Married    Spouse Name: N/A  . Number of Children: N/A  . Years of Education: N/A   Occupational History  . disabled    Social History Main Topics  . Smoking status: Current Every Day Smoker -- 1.00 packs/day for 10 years    Types: Cigarettes  . Smokeless tobacco: Never Used  . Alcohol Use: 0.0 oz/week    0 Standard drinks or equivalent per week     Comment: occasionally  . Drug Use: Yes    Special: Marijuana  . Sexual Activity: Yes    Birth Control/ Protection: Surgical   Other Topics Concern  . None   Social History Narrative   Married   No regular exercise   Additional Social History:    History of alcohol / drug use?: No history of alcohol / drug abuse                    Sleep: Fair  Appetite:  Fair  Current Medications: Current Facility-Administered Medications  Medication Dose Route Frequency Provider Last Rate Last Dose  . acetaminophen (TYLENOL) tablet 650 mg  650 mg Oral Q6H PRN Kristeen Mans, NP      . carbamazepine (EQUETRO) 12 hr capsule 200 mg  200 mg Oral BID Kristeen Mans, NP   200 mg at 04/18/15 0905  . ciprofloxacin (CIPRO) tablet 500 mg  500 mg Oral BID Adonis Brook, NP   500 mg at 04/18/15 0272  . diazepam (VALIUM) tablet 5 mg  5 mg Oral TID Kristeen Mans, NP   5 mg at 04/18/15 0906  . diltiazem (DILACOR XR) 24 hr capsule 240 mg  240 mg Oral Daily Kristeen Mans, NP   240 mg at 04/18/15 5366  . feeding supplement (ENSURE ENLIVE) (ENSURE ENLIVE) liquid 237 mL  237 mL Oral BID BM Rachael Fee, MD   237 mL at 04/18/15 0908   . metroNIDAZOLE (FLAGYL) tablet 500 mg  500 mg Oral Q12H Adonis Brook, NP   500 mg at 04/18/15 0907  . mirtazapine (REMERON) tablet 30 mg  30 mg Oral QHS Thresa Ross, MD   30 mg at 04/18/15 0036  . pantoprazole (PROTONIX) EC tablet 40 mg  40 mg Oral Daily Kristeen Mans, NP   40 mg at 04/18/15 4403  . traZODone (DESYREL) tablet 100 mg  100 mg Oral QHS PRN Thresa Ross, MD        Lab Results:  Results for orders placed or performed during the hospital encounter of 04/15/15 (from the past 48 hour(s))  Urinalysis, Routine w reflex microscopic (not at [facility])     Status: Abnormal   Collection Time: 04/17/15  4:12 PM  Result Value Ref Range   Color, Urine YELLOW YELLOW   APPearance CLOUDY (A) CLEAR   Specific Gravity, Urine 1.027 1.005 - 1.030   pH 5.5 5.0 - 8.0   Glucose, UA NEGATIVE NEGATIVE mg/dL   Hgb urine dipstick NEGATIVE NEGATIVE   Bilirubin Urine NEGATIVE NEGATIVE   Ketones, ur NEGATIVE NEGATIVE mg/dL   Protein, ur NEGATIVE NEGATIVE mg/dL   Nitrite NEGATIVE NEGATIVE   Leukocytes, UA NEGATIVE NEGATIVE    Comment: MICROSCOPIC NOT DONE ON URINES WITH NEGATIVE PROTEIN, BLOOD, LEUKOCYTES, NITRITE, OR GLUCOSE <1000 mg/dL. Performed at [facility]     Blood Alcohol level:  Lab Results  Component Value Date   [facility] <5 04/14/2015    Physical Findings: AIMS: Facial and Oral Movements Muscles of Facial Expression: None, normal Lips and Perioral Area: None, normal Jaw: None, normal Tongue: None, normal,Extremity Movements Upper (arms, wrists, hands, fingers): None, normal Lower (legs, knees, ankles, toes): None, normal, Trunk Movements Neck, shoulders, hips: None, normal, Overall Severity Severity of abnormal movements (highest score from questions above): None, normal Incapacitation due to abnormal movements: None, normal Patient's awareness of abnormal movements (rate only patient's report): No Awareness, Dental Status Current problems with teeth and/or  dentures?: No Does patient usually wear dentures?: No  CIWA:  CIWA-Ar Total: 1 COWS:  COWS Total Score: 4  Musculoskeletal: Strength & Muscle Tone: within normal limits Gait & Station: normal Patient leans: normal  Psychiatric Specialty Exam: Review of Systems  Constitutional: Positive for weight loss and malaise/fatigue.  HENT:       Bi temporal   Eyes: Negative.   Respiratory: Positive for cough.        Pack a day  Cardiovascular: Negative.   Gastrointestinal: Positive for heartburn.  Genitourinary: Positive for dysuria.  Musculoskeletal: Negative.   Skin: Negative.   Neurological: Positive for dizziness, weakness and headaches.  Endo/Heme/Allergies: Negative.   Psychiatric/Behavioral: Positive for depression. The patient is nervous/anxious and has insomnia.     Blood pressure 122/73, pulse 84, temperature 98.1 F (36.7 C), temperature source Oral, resp. rate 18, height 5' (1.524 m), weight 56.7 kg (125 lb), SpO2 100 %.Body mass index is 24.41 kg/(m^2).  General Appearance: Fairly Groomed  Patent attorney::  Fair  Speech:  Clear and Coherent  Volume:  fluctuates  Mood:  Anxious, Depressed and Dysphoric  Affect:  anxious worried  Thought Process:  Coherent and Goal Directed  Orientation:  Full (Time, Place, and Person)  Thought Content:  symptoms events worries concerns  Suicidal Thoughts:  No  Homicidal Thoughts:  No  Memory:  Immediate;   Fair Recent;   Fair Remote;   Fair  Judgement:  Fair  Insight:  Present and Shallow  Psychomotor Activity:  Restlessness  Concentration:  Fair  Recall:  [employer] of Knowledge:Fair  Language: Fair  Akathisia:  No  Handed:  Right  AIMS (if indicated):     Assets:  Desire for Improvement  ADL's:  Intact  Cognition: WNL  Sleep:  Number of Hours: 5   Treatment Plan Summary: Daily contact with patient to assess and evaluate symptoms and progress in treatment and Medication management Supportive approach/coping skills Mood  instability; will pursue the Trial with Tegretol what she seems to have tolerated better than the Depakote ( she was placed on a high 1500 mg dose of Depakote with no follow level) Insomnia/depression; will work with Remeron 30 mg HS ( she was given both the Trazodone and the Remeron last night and she has been groggy during the AM) will D/C the Trazodone Anxiety; she has been on Valium 5 mg TID for more than 10 year will not attempt to wean off right now Work with CBT/mindfulness Melayna Robarts A, MD 04/18/2015, 11:32 AM

## 2015-04-19 LAB — URINE CULTURE

## 2015-04-19 MED ORDER — HALOPERIDOL 5 MG PO TABS
5.0000 mg | ORAL_TABLET | Freq: Once | ORAL | Status: AC
Start: 2015-04-19 — End: 2015-04-19
  Administered 2015-04-19: 5 mg via ORAL
  Filled 2015-04-19 (×2): qty 1

## 2015-04-19 MED ORDER — HYDROXYZINE HCL 50 MG PO TABS
50.0000 mg | ORAL_TABLET | Freq: Four times a day (QID) | ORAL | Status: DC | PRN
  Administered 2015-04-19 – 2015-04-20 (×2): 50 mg via ORAL
  Filled 2015-04-19 (×2): qty 1

## 2015-04-19 NOTE — Progress Notes (Signed)
Adult Psychoeducational Group Note  Date:  04/19/2015 Time:  8:20 PM  Group Topic/Focus:  Wrap-Up Group:   The focus of this group is to help patients review their daily goal of treatment and discuss progress on daily workbooks.  Participation Level:  None  Participation Quality:  Attentive  Affect:  Depressed and Tearful  Cognitive:  Lacking  Insight: Lacking  Engagement in Group:  None  Modes of Intervention:  Discussion  Additional Comments:  Pt was depressed and tearful during wrap-up group and stated that she did not want to share.   Cleotilde Neer 04/19/2015, 9:09 PM

## 2015-04-19 NOTE — [facility] Group Notes (Signed)
[facility] LCSW Group Therapy  04/19/2015 1:49 PM  Type of Therapy:  Group Therapy  Participation Level:  Active  Participation Quality:  Attentive  Affect:  Appropriate  Cognitive:  Alert and Oriented  Insight:  Engaged  Engagement in Therapy:  Improving  Modes of Intervention:  Confrontation, Discussion, Education, Exploration, Problem-solving, Rapport Building, Socialization and Support  Summary of Progress/Problems: MHA Speaker came to talk about his personal journey with substance abuse and addiction. The pt processed ways by which to relate to the speaker. MHA speaker provided handouts and educational information pertaining to groups and services offered by the [facility]   Smart, [place] LCSW 04/19/2015, 1:49 PM

## 2015-04-19 NOTE — [facility] Group Notes (Signed)
Late Entry    [facility] LCSW Aftercare Discharge Planning Group Note  04/18/15  8:45 AM   Participation Quality: Alert, Appropriate and Oriented  Mood/Affect: Depressed and Flat  Depression Rating: 7  Anxiety Rating: 3  Thoughts of Suicide: Pt denies SI/HI  Will you contract for safety? Yes  Current AVH: Pt denies  Plan for Discharge/Comments: Pt attended discharge planning group and actively participated in group. CSW provided pt with today's workbook. Patient plans to return home to follow up with outpatient providers in [facility].  Transportation Means: Pt reports access to transportation  Supports: No supports mentioned at this time  Samuella Bruin, MSW, [employer] Clinical Social Worker [employer] [phone]

## 2015-04-19 NOTE — Progress Notes (Signed)
[facility] MD Progress Note  04/19/2015 7:08 PM Gwendolyn [name]  MRN:  [id number] Subjective:  Not sure what is going on with her. She was doing really well yesterday but today feeling nauseated. Feels it has to do with having taken her medications without having eaten in the morning. She is also aware that the antibiotics might have given her some nausea. She is willing to pursue the medications and continue to give them a try. States she wants these medications to really work Principal Problem: Bipolar 1 disorder, depressed, severe (HCC) Diagnosis:   Patient Active Problem List   Diagnosis Date Noted  . Marijuana abuse [F12.10]   . Opioid use disorder, moderate, dependence (HCC) [F11.20] 04/15/2015  . Bipolar disorder with depression (HCC) [F31.30] 04/15/2015  . Bipolar 1 disorder, depressed, severe (HCC) [F31.4] 04/15/2015  . Pre-operative cardiovascular examination [Z01.810] 08/25/2014  . H/O sinus tachycardia [Z86.79] 08/25/2014  . Tobacco abuse [Z72.0]   . Palpitations [R00.2]   . SVT (supraventricular tachycardia) (HCC) [I47.1]   . Bipolar disorder (HCC) [F31.9]   . Depression [F32.9]   . Migraines [G43.909]   . GERD (gastroesophageal reflux disease) [K21.9]   . Ejection fraction [R09.89]    Total Time spent with patient: 20 minutes  Past Psychiatric History: see admission H and P  Past Medical History:  Past Medical History  Diagnosis Date  . SVT (supraventricular tachycardia) (HCC)   . Bipolar disorder (HCC)   . Depression   . Migraines   . GERD (gastroesophageal reflux disease)   . Ejection fraction   . Tobacco abuse   . Palpitations     Past Surgical History  Procedure Laterality Date  . Cholecystectomy    . Tubal ligation    . Abdominal hysterectomy     Family History:  Family History  Problem Relation Age of Onset  . Heart disease Father   . Heart disease Other    Family Psychiatric  History: see admission H and P Social History:  History  Alcohol Use  .  0.0 oz/week  . 0 Standard drinks or equivalent per week    Comment: occasionally     History  Drug Use  . Yes  . Special: Marijuana    Social History   Social History  . Marital Status: Married    Spouse Name: N/A  . Number of Children: N/A  . Years of Education: N/A   Occupational History  . disabled    Social History Main Topics  . Smoking status: Current Every Day Smoker -- 1.00 packs/day for 10 years    Types: Cigarettes  . Smokeless tobacco: Never Used  . Alcohol Use: 0.0 oz/week    0 Standard drinks or equivalent per week     Comment: occasionally  . Drug Use: Yes    Special: Marijuana  . Sexual Activity: Yes    Birth Control/ Protection: Surgical   Other Topics Concern  . None   Social History Narrative   Married   No regular exercise   Additional Social History:    History of alcohol / drug use?: No history of alcohol / drug abuse                    Sleep: Fair  Appetite:  Poor  Current Medications: Current Facility-Administered Medications  Medication Dose Route Frequency Provider Last Rate Last Dose  . acetaminophen (TYLENOL) tablet 650 mg  650 mg Oral Q6H PRN Kristeen Mans, NP   650 mg at  04/18/15 1551  . carbamazepine (EQUETRO) 12 hr capsule 200 mg  200 mg Oral BID Kristeen Mans, NP   200 mg at 04/19/15 1649  . ciprofloxacin (CIPRO) tablet 500 mg  500 mg Oral BID Adonis Brook, NP   500 mg at 04/19/15 1107  . diazepam (VALIUM) tablet 5 mg  5 mg Oral TID Kristeen Mans, NP   5 mg at 04/19/15 1649  . diltiazem (DILACOR XR) 24 hr capsule 240 mg  240 mg Oral Daily Kristeen Mans, NP   240 mg at 04/19/15 1108  . feeding supplement (ENSURE ENLIVE) (ENSURE ENLIVE) liquid 237 mL  237 mL Oral BID BM Rachael Fee, MD   237 mL at 04/19/15 1109  . metroNIDAZOLE (FLAGYL) tablet 500 mg  500 mg Oral Q12H Adonis Brook, NP   500 mg at 04/19/15 1108  . mirtazapine (REMERON) tablet 30 mg  30 mg Oral QHS Thresa Ross, MD   30 mg at 04/18/15 2139  .  pantoprazole (PROTONIX) EC tablet 40 mg  40 mg Oral Daily Kristeen Mans, NP   40 mg at 04/19/15 1107    Lab Results: No results found for this or any previous visit (from the past 48 hour(s)).  Blood Alcohol level:  Lab Results  Component Value Date   ETH <5 04/14/2015    Physical Findings: AIMS: Facial and Oral Movements Muscles of Facial Expression: None, normal Lips and Perioral Area: None, normal Jaw: None, normal Tongue: None, normal,Extremity Movements Upper (arms, wrists, hands, fingers): None, normal Lower (legs, knees, ankles, toes): None, normal, Trunk Movements Neck, shoulders, hips: None, normal, Overall Severity Severity of abnormal movements (highest score from questions above): None, normal Incapacitation due to abnormal movements: None, normal Patient's awareness of abnormal movements (rate only patient's report): No Awareness, Dental Status Current problems with teeth and/or dentures?: No Does patient usually wear dentures?: No  CIWA:  CIWA-Ar Total: 1 COWS:  COWS Total Score: 2  Musculoskeletal: Strength & Muscle Tone: within normal limits Gait & Station: normal Patient leans: normal  Psychiatric Specialty Exam: Review of Systems  Constitutional: Positive for malaise/fatigue.  HENT: Negative.   Eyes: Negative.   Respiratory: Negative.   Cardiovascular: Negative.   Gastrointestinal: Positive for nausea and vomiting.  Genitourinary: Negative.   Musculoskeletal: Negative.   Skin: Negative.   Neurological: Positive for weakness.  Endo/Heme/Allergies: Negative.   Psychiatric/Behavioral: Positive for depression and substance abuse. The patient is nervous/anxious.     Blood pressure 103/67, pulse 76, temperature 98.6 F (37 C), temperature source Oral, resp. rate 20, height 5' (1.524 m), weight 56.7 kg (125 lb), SpO2 100 %.Body mass index is 24.41 kg/(m^2).  General Appearance: Fairly Groomed  Patent attorney::  Fair  Speech:  Clear and Coherent  Volume:   Normal  Mood:  Anxious and worried  Affect:  anxious worried  Thought Process:  Coherent and Goal Directed  Orientation:  Full (Time, Place, and Person)  Thought Content:  symptoms events worries concerns  Suicidal Thoughts:  No  Homicidal Thoughts:  No  Memory:  Immediate;   Fair Recent;   Fair Remote;   Fair  Judgement:  Fair  Insight:  Present and Shallow  Psychomotor Activity:  Restlessness  Concentration:  Fair  Recall:  [employer] of Knowledge:Fair  Language: Fair  Akathisia:  No  Handed:  Right  AIMS (if indicated):     Assets:  Desire for Improvement  ADL's:  Intact  Cognition: WNL  Sleep:  Number of Hours: 5.75   Treatment Plan Summary: Daily contact with patient to assess and evaluate symptoms and progress in treatment and Medication management Supportive approach/coping skills  Substance abuse; continue to work a relapse prevention plan Anxiety; continue the Valium  Insomnia/depression/anxiety;continue the Remeron Mood instability; continue the [employer] Get a tregretol level Work with CBT/mindfulness Ariyan Sinnett A, MD 04/19/2015, 7:08 PM

## 2015-04-19 NOTE — Progress Notes (Signed)
Recreation Therapy Notes  Animal-Assisted Activity (AAA) Program Checklist/Progress Notes Patient Eligibility Criteria Checklist & Daily Group note for Rec Tx Intervention  Date: 02.21.2017 Time: 2:45pm Location: [address]   AAA/T Program Assumption of Risk Form signed by Patient/ or Parent Legal Guardian yes  Patient is free of allergies or sever asthma yes  Patient reports no fear of animals yes  Patient reports no history of cruelty to animals yes  Patient understands his/her participation is voluntary yes  Patient washes hands before animal contact yes  Patient washes hands after animal contact yes  Behavioral Response: Appropriate, Engaged   Education: Hand Washing, Appropriate Animal Interaction   Education Outcome: Acknowledges education.   Clinical Observations/Feedback: Patient appropriately engaged with therapy dog and peers in session. Patient pet therapy dog appropriately from floor level and shared stories about his pets at home with group.   Sacheen Arrasmith L Hatsumi Steinhart, LRT/CTRS  Kathern Lobosco L 04/19/2015 3:09 PM 

## 2015-04-19 NOTE — Progress Notes (Signed)
D: Patient slept through morning med pass.  She states her depressive symptoms have decreased.  She rates her depression and anxiety as a 3; hopelessness as 0.  She denies SI/HI/AVH.  She is observed in day room interacting well with her peers.  Her goal is "to go home."  A: Continue to monitor medication management and MD orders.  Safety checks completed every 15 minutes per protocol.  Offer support and encouragement as needed. R: Patient is receptive to staff; her behavior is appropriate.

## 2015-04-19 NOTE — Progress Notes (Signed)
D    Pt was very anxious and nervous   She was upset over her medications being changed    She requested staff take her out to smoke a cigarette because she cant use the patch or the gum    Pt interacted with select peers and was appropriate in her behaviors A    Verbal support given    Medications administered and effectiveness monitored    Q 15 min checks R    Pt safe at present and receptive to verbal support

## 2015-04-20 LAB — CARBAMAZEPINE LEVEL, TOTAL: Carbamazepine Lvl: 12.2 ug/mL — ABNORMAL HIGH (ref 4.0–12.0)

## 2015-04-20 NOTE — Progress Notes (Signed)
D: Patient slept through morning med pass.  She is less anxious that she was yesterday. She states, "that husband of mine got me upset last night."  Patient states that her plan is "to stay calm today."   Patient appears lethargic and slow.  Her goal today is to "go home."  She denies SI/HI/AVH.  She is interacting well with staff and her peers.   A: Continue to monitor medication management and MD orders.  Safety checks continued every 15 minutes per protocol.  Offer support and encouragement as needed. R: Patient is receptive to staff; her behavior is appropriate.

## 2015-04-20 NOTE — Progress Notes (Signed)
D    Pt was very anxious and nervous   She was upset over her medications being changed    She requested staff take her out to smoke a cigarette because she cant use the patch or the gum    Pt interacted with select peers and was appropriate in her behaviors   Pt was tearful and said she just spoke to her husband and he was taking her kids away  A    Verbal support given    Medications administered and effectiveness monitored    Q 15 min checks R    Pt safe at present and receptive to verbal support

## 2015-04-20 NOTE — [facility] Suicide Risk Assessment (Signed)
[facility] INPATIENT:  Family/Significant Other Suicide Prevention Education  Suicide Prevention Education:  Education Completed; husband Gwendolyn [name] [phone],  (name of family member/significant other) has been identified by the patient as the family member/significant other with whom the patient will be residing, and identified as the person(s) who will aid the patient in the event of a mental health crisis (suicidal ideations/suicide attempt).  With written consent from the patient, the family member/significant other has been provided the following suicide prevention education, prior to the and/or following the discharge of the patient.  The suicide prevention education provided includes the following:  Suicide risk factors  Suicide prevention and interventions  National Suicide Hotline telephone number  [facility] assessment telephone number  [facility] Emergency Assistance 911  [facility] and/or Residential Mobile Crisis Unit telephone number  Request made of family/significant other to:  Remove weapons (e.g., guns, rifles, knives), all items previously/currently identified as safety concern.    Remove drugs/medications (over-the-counter, prescriptions, illicit drugs), all items previously/currently identified as a safety concern.  The family member/significant other verbalizes understanding of the suicide prevention education information provided.  The family member/significant other agrees to remove the items of safety concern listed above.  Gwendolyn [name], Gwendolyn [name] 04/20/2015, 11:45 AM

## 2015-04-20 NOTE — Progress Notes (Signed)
Recreation Therapy Notes Date: 02.22.2017 Time: 9:30am Location: [address] Room   Group Topic: Stress Management  Goal Area(s) Addresses:  Patient will actively participate in stress management techniques presented during session.   Behavioral Response: Did not attend.   Phoenix Dresser L Brandn Mcgath, LRT/CTRS  Markanthony Gedney L 04/20/2015 10:18 AM 

## 2015-04-20 NOTE — Progress Notes (Signed)
Pt did not attend evening NA group.  

## 2015-04-20 NOTE — Progress Notes (Signed)
[facility] MD Progress Note  04/20/2015 5:27 PM Gwendolyn [name]  MRN:  [id number] Subjective:  States that yesterday was overall a better day until the evening when she talked with her husband. States that he still does not get it. She states she is grateful this happened here as she was able to process what happened with staff and try her coping skills. Feels that she was able to handle it better than she would had been able to handle it before she came here Principal Problem: Bipolar 1 disorder, depressed, severe (HCC) Diagnosis:   Patient Active Problem List   Diagnosis Date Noted  . Marijuana abuse [F12.10]   . Opioid use disorder, moderate, dependence (HCC) [F11.20] 04/15/2015  . Bipolar disorder with depression (HCC) [F31.30] 04/15/2015  . Bipolar 1 disorder, depressed, severe (HCC) [F31.4] 04/15/2015  . Pre-operative cardiovascular examination [Z01.810] 08/25/2014  . H/O sinus tachycardia [Z86.79] 08/25/2014  . Tobacco abuse [Z72.0]   . Palpitations [R00.2]   . SVT (supraventricular tachycardia) (HCC) [I47.1]   . Bipolar disorder (HCC) [F31.9]   . Depression [F32.9]   . Migraines [G43.909]   . GERD (gastroesophageal reflux disease) [K21.9]   . Ejection fraction [R09.89]    Total Time spent with patient: 20 minutes  Past Psychiatric History: see admission H and P  Past Medical History:  Past Medical History  Diagnosis Date  . SVT (supraventricular tachycardia) (HCC)   . Bipolar disorder (HCC)   . Depression   . Migraines   . GERD (gastroesophageal reflux disease)   . Ejection fraction   . Tobacco abuse   . Palpitations     Past Surgical History  Procedure Laterality Date  . Cholecystectomy    . Tubal ligation    . Abdominal hysterectomy     Family History:  Family History  Problem Relation Age of Onset  . Heart disease Father   . Heart disease Other    Family Psychiatric  History: see admission H and P Social History:  History  Alcohol Use  . 0.0 oz/week  . 0  Standard drinks or equivalent per week    Comment: occasionally     History  Drug Use  . Yes  . Special: Marijuana    Social History   Social History  . Marital Status: Married    Spouse Name: N/A  . Number of Children: N/A  . Years of Education: N/A   Occupational History  . disabled    Social History Main Topics  . Smoking status: Current Every Day Smoker -- 1.00 packs/day for 10 years    Types: Cigarettes  . Smokeless tobacco: Never Used  . Alcohol Use: 0.0 oz/week    0 Standard drinks or equivalent per week     Comment: occasionally  . Drug Use: Yes    Special: Marijuana  . Sexual Activity: Yes    Birth Control/ Protection: Surgical   Other Topics Concern  . None   Social History Narrative   Married   No regular exercise   Additional Social History:    History of alcohol / drug use?: No history of alcohol / drug abuse                    Sleep: Fair  Appetite:  Fair  Current Medications: Current Facility-Administered Medications  Medication Dose Route Frequency Provider Last Rate Last Dose  . acetaminophen (TYLENOL) tablet 650 mg  650 mg Oral Q6H PRN Kristeen Mans, NP   650 mg  at 04/20/15 1656  . carbamazepine (EQUETRO) 12 hr capsule 200 mg  200 mg Oral BID Kristeen Mans, NP   200 mg at 04/20/15 1656  . diazepam (VALIUM) tablet 5 mg  5 mg Oral TID Kristeen Mans, NP   5 mg at 04/20/15 1656  . diltiazem (DILACOR XR) 24 hr capsule 240 mg  240 mg Oral Daily Kristeen Mans, NP   240 mg at 04/20/15 1018  . feeding supplement (ENSURE ENLIVE) (ENSURE ENLIVE) liquid 237 mL  237 mL Oral BID BM Rachael Fee, MD   237 mL at 04/20/15 1021  . hydrOXYzine (ATARAX/VISTARIL) tablet 50 mg  50 mg Oral Q6H PRN Kerry Hough, PA-C   50 mg at 04/19/15 2151  . metroNIDAZOLE (FLAGYL) tablet 500 mg  500 mg Oral Q12H Adonis Brook, NP   500 mg at 04/20/15 1019  . mirtazapine (REMERON) tablet 30 mg  30 mg Oral QHS Thresa Ross, MD   30 mg at 04/19/15 2153  . pantoprazole  (PROTONIX) EC tablet 40 mg  40 mg Oral Daily Kristeen Mans, NP   40 mg at 04/20/15 1019    Lab Results:  Results for orders placed or performed during the hospital encounter of 04/15/15 (from the past 48 hour(s))  Carbamazepine level, total     Status: Abnormal   Collection Time: 04/20/15  6:25 AM  Result Value Ref Range   Carbamazepine Lvl 12.2 (H) 4.0 - 12.0 ug/mL    Comment: Performed at [facility]    Blood Alcohol level:  Lab Results  Component Value Date   [facility] <5 04/14/2015    Physical Findings: AIMS: Facial and Oral Movements Muscles of Facial Expression: None, normal Lips and Perioral Area: None, normal Jaw: None, normal Tongue: None, normal,Extremity Movements Upper (arms, wrists, hands, fingers): None, normal Lower (legs, knees, ankles, toes): None, normal, Trunk Movements Neck, shoulders, hips: None, normal, Overall Severity Severity of abnormal movements (highest score from questions above): None, normal Incapacitation due to abnormal movements: None, normal Patient's awareness of abnormal movements (rate only patient's report): No Awareness, Dental Status Current problems with teeth and/or dentures?: No Does patient usually wear dentures?: No  CIWA:  CIWA-Ar Total: 1 COWS:  COWS Total Score: 2  Musculoskeletal: Strength & Muscle Tone: within normal limits Gait & Station: normal Patient leans: normal  Psychiatric Specialty Exam: Review of Systems  Constitutional: Negative.   HENT: Negative.   Eyes: Negative.   Respiratory: Negative.   Cardiovascular: Negative.   Gastrointestinal: Negative.   Genitourinary: Negative.   Musculoskeletal: Negative.   Skin: Negative.   Neurological: Negative.   Endo/Heme/Allergies: Negative.   Psychiatric/Behavioral: Positive for substance abuse. The patient is nervous/anxious.     Blood pressure 97/64, pulse 74, temperature 98.4 F (36.9 C), temperature source Oral, resp. rate 16, height 5' (1.524 m), weight  56.7 kg (125 lb), SpO2 100 %.Body mass index is 24.41 kg/(m^2).  General Appearance: Fairly Groomed  Patent attorney::  Fair  Speech:  Clear and Coherent  Volume:  Normal  Mood:  Anxious  Affect:  Appropriate  Thought Process:  Coherent and Goal Directed  Orientation:  Full (Time, Place, and Person)  Thought Content:  Symptoms events worries concerns  Suicidal Thoughts:  No  Homicidal Thoughts:  No  Memory:  Immediate;   Fair Recent;   Fair Remote;   Fair  Judgement:  Fair  Insight:  Present  Psychomotor Activity:  Restlessness  Concentration:  Fair  Recall:  Fair  Fund of Knowledge:Fair  Language: Fair  Akathisia:  No  Handed:  Right  AIMS (if indicated):     Assets:  Desire for Improvement Housing  ADL's:  Intact  Cognition: WNL  Sleep:  Number of Hours: 5.5   Treatment Plan Summary: Daily contact with patient to assess and evaluate symptoms and progress in treatment and Medication management Supportive approach/coping skills Substance abuse; work a relapse prevention plan Mood instability; continue the Equetro 200 mg BID Level 12.2 will continue current dose Insomnia; continue the Remeron 30 mg HS Anxiety; continue the Valium 5 mg  Will continue to work with CBT/mindfulness and validate her efforts to be assertive and not aggressive  Kikuye Korenek A, MD 04/20/2015, 5:27 PM

## 2015-04-21 MED ORDER — METRONIDAZOLE 500 MG PO TABS: 500.0000 mg | ORAL_TABLET | Freq: Two times a day (BID) | ORAL | Status: DC

## 2015-04-21 MED ORDER — DILTIAZEM HCL ER 240 MG PO CP24: 240.0000 mg | ORAL_CAPSULE | Freq: Every day | ORAL | Status: DC

## 2015-04-21 MED ORDER — CARBAMAZEPINE ER 200 MG PO CP12: 200.0000 mg | ORAL_CAPSULE | Freq: Two times a day (BID) | ORAL | Status: DC

## 2015-04-21 MED ORDER — OMEPRAZOLE 20 MG PO CPDR: 20.0000 mg | DELAYED_RELEASE_CAPSULE | Freq: Every day | ORAL | Status: DC

## 2015-04-21 MED ORDER — HYDROXYZINE HCL 50 MG PO TABS: 50.0000 mg | ORAL_TABLET | Freq: Four times a day (QID) | ORAL | Status: DC | PRN

## 2015-04-21 MED ORDER — MIRTAZAPINE 30 MG PO TABS: 30.0000 mg | ORAL_TABLET | Freq: Every day | ORAL | Status: DC

## 2015-04-21 MED ORDER — DIAZEPAM 5 MG PO TABS: 5.0000 mg | ORAL_TABLET | Freq: Three times a day (TID) | ORAL | Status: DC | PRN

## 2015-04-21 NOTE — Discharge Summary (Signed)
Physician Discharge Summary Note  Patient:  Gwendolyn [name] is an 35 y.o., female MRN:  [id number] DOB:  09-Mar-1980 Patient phone:  [phone] (home)  Patient address:   [address] [place] [address],  Total Time spent with patient: Greater than 30 minutes  Date of Admission:  04/15/2015 Date of Discharge: 04-21-15  Reason for Admission: Worsening symptoms of Bipolar depression  Principal Problem: Bipolar 1 disorder, depressed, severe [facility])  Discharge Diagnoses: Patient Active Problem List   Diagnosis Date Noted  . Marijuana abuse [F12.10]   . Opioid use disorder, moderate, dependence (HCC) [F11.20] 04/15/2015  . Bipolar disorder with depression (HCC) [F31.30] 04/15/2015  . Bipolar 1 disorder, depressed, severe (HCC) [F31.4] 04/15/2015  . Pre-operative cardiovascular examination [Z01.810] 08/25/2014  . H/O sinus tachycardia [Z86.79] 08/25/2014  . Tobacco abuse [Z72.0]   . Palpitations [R00.2]   . SVT (supraventricular tachycardia) (HCC) [I47.1]   . Bipolar disorder (HCC) [F31.9]   . Depression [F32.9]   . Migraines [G43.909]   . GERD (gastroesophageal reflux disease) [K21.9]   . Ejection fraction [R09.89]    Past Psychiatric History: Bipolar affective disorder  Past Medical History:  Past Medical History  Diagnosis Date  . SVT (supraventricular tachycardia) (HCC)   . Bipolar disorder (HCC)   . Depression   . Migraines   . GERD (gastroesophageal reflux disease)   . Ejection fraction   . Tobacco abuse   . Palpitations     Past Surgical History  Procedure Laterality Date  . Cholecystectomy    . Tubal ligation    . Abdominal hysterectomy     Family History:  Family History  Problem Relation Age of Onset  . Heart disease Father   . Heart disease Other    Family Psychiatric  History: See H&P  Social History:  History  Alcohol Use  . 0.0 oz/week  . 0 Standard drinks or equivalent per week    Comment: occasionally     History  Drug Use  . Yes  .  Special: Marijuana    Social History   Social History  . Marital Status: Married    Spouse Name: N/A  . Number of Children: N/A  . Years of Education: N/A   Occupational History  . disabled    Social History Main Topics  . Smoking status: Current Every Day Smoker -- 1.00 packs/day for 10 years    Types: Cigarettes  . Smokeless tobacco: Never Used  . Alcohol Use: 0.0 oz/week    0 Standard drinks or equivalent per week     Comment: occasionally  . Drug Use: Yes    Special: Marijuana  . Sexual Activity: Yes    Birth Control/ Protection: Surgical   Other Topics Concern  . None   Social History Narrative   Married   No regular exercise   Hospital Course: Gwendolyn [name] is a married 35 year old Caucasian female who presented to [facility] ED voluntary for evaluation of depressive symptoms. She stated she has had increasing depression for several months. She has been on Wellbutrin and Zoloft in the past "but they stopped working." She has been to her PCP who started her on Depakote but she states it made her "feel drowsy and dizzy" and made it difficult to provide care to her younger children.She went to follow-up appointment with PCPand completed a depression risk assessment. The patient stated "my scores were so high that they handed me a piece of paper that said go directly to [facility]  [facility]al [facility]." She received an assessment there and was sent to [facility] ED for medical clearance. On evaluation today still endorses feeling down, depressed but not suicidal. Says she has been loosing weight and decreaed apetite. Her uds shows marijuana and opiates. Has been feeling anxious and says she has to keep taking valium as prescribed before. She has been started on tegretol and remeron as well for mood and depression.  Gwendolyn [name] was admitted to th [facility] adult unit with complaints of worsening symptoms of depression . She cited her previous antidepressant regimen (Wellbutrin & Sertraline) as no  longer effective in managing her depression. She was in need of mood stabilization treatment/medication adjustment. During the course of her hospitalization, Gwendolyn [name] was medicated & discharged on, Tegretol (Equetro) 12 hr 200 mg for mood stabilization, Hydroxyzine 50 mg for anxiety & Mirtazapine 30 mg for depression/insomnia, She was enrolled & participated in the group counseling sessions being offered & held on this unit. She was counseled & learned coping skills that should help her cope better & maintain mood stability after discharge. She was resumed on all her pertinent home medications for the other previously existing medical issues that she presented. She tolerated her treatment regimen without any adverse effects reported. While her treatment was on going, Gwendolyn [name]'s improvement was monitored by observation & her daily reports of symptom reduction noted.  Her emotional & mental status were monitored by daily self-inventory reports completed by her & the clinical staff.          Gwendolyn [name] was evaluated daily by the treatment team for mood stability & the need for continued recovery after discharge. Her motivation was an integral factor in her recovery & mood stability. She was offered further treatment options upon discharge & will follow up with the outpatient psychiatric services as listed below.     Upon discharge, She was both mentally & medically stable. She is currently denying suicidal, homicidal ideation, auditory, visual/tactile hallucinations, delusional thoughts & or paranoia. Gwendolyn [name] left [facility] with all personal belongings in no apparent distress. Transportation per husband.         Physical Findings: AIMS: Facial and Oral Movements Muscles of Facial Expression: None, normal Lips and Perioral Area: None, normal Jaw: None, normal Tongue: None, normal,Extremity Movements Upper (arms, wrists, hands, fingers): None, normal Lower (legs, knees, ankles, toes): None, normal, Trunk  Movements Neck, shoulders, hips: None, normal, Overall Severity Severity of abnormal movements (highest score from questions above): None, normal Incapacitation due to abnormal movements: None, normal Patient's awareness of abnormal movements (rate only patient's report): No Awareness, Dental Status Current problems with teeth and/or dentures?: No Does patient usually wear dentures?: No  CIWA:  CIWA-Ar Total: 1 COWS:  COWS Total Score: 2  Musculoskeletal: Strength & Muscle Tone: within normal limits Gait & Station: normal Patient leans: N/A  Psychiatric Specialty Exam: Review of Systems  Constitutional: Negative.   HENT: Negative.   Eyes: Negative.   Respiratory: Negative.   Cardiovascular: Negative.   Gastrointestinal: Negative.   Genitourinary: Negative.   Musculoskeletal: Negative.   Skin: Negative.   Neurological: Negative.   Endo/Heme/Allergies: Negative.   Psychiatric/Behavioral: Positive for depression (Stable) and substance abuse (Cannabis abuse). Negative for suicidal ideas, hallucinations and memory loss. The patient has insomnia (Stable). The patient is not nervous/anxious.     Blood pressure 110/61, pulse 73, temperature 98 F (36.7 C), temperature source Oral, resp. rate 12, height 5' (1.524 m), weight 56.7 kg (125 lb), SpO2 100 %.Body mass index is 24.41  kg/(m^2).  See Md's SRA   Have you used any form of tobacco in the last 30 days? (Cigarettes, Smokeless Tobacco, Cigars, and/or Pipes): Yes  Has this patient used any form of tobacco in the last 30 days? (Cigarettes, Smokeless Tobacco, Cigars, and/or Pipes): No  Blood Alcohol level:  Lab Results  Component Value Date   ETH <5 04/14/2015   Metabolic Disorder Labs:  No results found for: HGBA1C, MPG No results found for: PROLACTIN No results found for: CHOL, TRIG, HDL, CHOLHDL, VLDL, LDLCALC  See Psychiatric Specialty Exam and Suicide Risk Assessment completed by Attending Physician prior to  discharge.  Discharge destination:  Home  Is patient on multiple antipsychotic therapies at discharge:  No   Has Patient had three or more failed trials of antipsychotic monotherapy by history:  No  Recommended Plan for Multiple Antipsychotic Therapies: NA    Medication List    TAKE these medications      Indication   carbamazepine 200 MG Cp12 12 hr capsule  Commonly known as:  EQUETRO  Take 1 capsule (200 mg total) by mouth 2 (two) times daily. For mood stabilization   Indication:  Mood stabilization     diazepam 5 MG tablet  Commonly known as:  VALIUM  Take 1 tablet (5 mg total) by mouth 3 (three) times daily as needed for anxiety.   Indication:  Anxiety     diltiazem 240 MG 24 hr capsule  Commonly known as:  DILACOR XR  Take 1 capsule (240 mg total) by mouth daily. For high blood pressure   Indication:  High Blood Pressure     hydrOXYzine 50 MG tablet  Commonly known as:  ATARAX/VISTARIL  Take 1 tablet (50 mg total) by mouth every 6 (six) hours as needed for anxiety.   Indication:  Anxiety     metroNIDAZOLE 500 MG tablet  Commonly known as:  FLAGYL  Take 1 tablet (500 mg total) by mouth every 12 (twelve) hours. For yeast infection   Indication:  Vaginosis caused by Bacteria     mirtazapine 30 MG tablet  Commonly known as:  REMERON  Take 1 tablet (30 mg total) by mouth at bedtime. For depression/insomnia   Indication:  Trouble Sleeping, Major Depressive Disorder     omeprazole 20 MG capsule  Commonly known as:  PRILOSEC  Take 1 capsule (20 mg total) by mouth daily. For acid reflux   Indication:  Gastroesophageal Reflux Disease       Follow-up Information    [facility] with [facility] On 04/25/2015.   Why:  Assessment for therapy and medication management services on Monday February 27th at 10:30am. Please arrive at 10am to complete new patient paperwork and bring your medicaid card/ID to appt. Call office if you need to reschedule.   Contact information:    [address], [address] [place], [place]  [address] Ph:  [phone]   (fax) [phone]     Follow-up recommendations: Activity:  As tolerated Diet: As recommended by your primary care doctor. Keep all scheduled follow-up appointments as recommended.  Comments: Take all your medications as prescribed by your mental healthcare provider. Report any adverse effects and or reactions from your medicines to your outpatient provider promptly. Patient is instructed and cautioned to not engage in alcohol and or illegal drug use while on prescription medicines. In the event of worsening symptoms, patient is instructed to call the crisis hotline, 911 and or go to the nearest ED for appropriate evaluation  and treatment of symptoms. Follow-up with your primary care provider for your other medical issues, concerns and or health care needs.   Signed: Sanjuana Kava, NP, PMHNP, FNP-BC 04/21/2015, 10:12 AM   I personally assessed the patient and formulated the plan Madie Reno A. Dub Mikes, M.D.

## 2015-04-21 NOTE — [facility] Group Notes (Addendum)
Late Entry:  [facility] LCSW Group Therapy 04/20/15  1:15 pm  Type of Therapy: Group Therapy Participation Level: Active  Participation Quality: Attentive, Sharing and Supportive  Affect: Appropriate  Cognitive: Alert and Oriented  Insight: Developing/Improving and Engaged  Engagement in Therapy: Developing/Improving and Engaged  Modes of Intervention: Clarification, Confrontation, Discussion, Education, Exploration,  Limit-setting, Orientation, Problem-solving, Rapport Building, Dance movement psychotherapist, Socialization and Support  Summary of Progress/Problems: CSW and patients reviewed community resources including AA/NA groups, [employer] [place] support groups, housing resources, and grief counseling. Patients discussed which programs would be beneficial for them in their recovery. Patient disclosed that her niece died of an overdose and discussed the ways in which grief as well as her brother's drug use impacts her life.  Samuella Bruin, LCSW Clinical Social Worker [facility] [phone]

## 2015-04-21 NOTE — [facility] Group Notes (Signed)

## 2015-04-21 NOTE — Progress Notes (Signed)
D   Pt is more positive and thankful for support she received yesterday when she got upset    She reports the doctor said she could go home tomorrow and she is excited about it  A   Verbal support given   Medications administered and effectiveness monitored   Q 15 min checks R   Pt safe at present

## 2015-04-21 NOTE — Tx Team (Signed)
Interdisciplinary Treatment Plan Update (Adult)  Date:  04/21/2015  Time Reviewed:  9:30am  Progress in Treatment: Attending groups: Yes. Participating in groups:  Yes. Taking medication as prescribed:  Yes. Tolerating medication:  Yes. Family/Significant othe contact made:  Yes, SPE completed with husband Patient understands diagnosis:  Yes. and As evidenced by:  seeking treatment for depression, passive SI, and for medication stabilizatio. Discussing patient identified problems/goals with staff:  Yes. Medical problems stabilized or resolved:  Yes. Denies suicidal/homicidal ideation: Yes, denies Issues/concerns per patient self-inventory:  Other:  Discharge Plan or Barriers: Patient will return home to follow up with outpatient providers.   Reason for Continuation of Hospitalization: Depression Medication stabilization Suicidal ideation  Comments:  Gwendolyn [name] is an 35 y.o. female who presents voluntarily to [facility] as a walk-in under express orders from her PCP, Dr. Gar Ponto. Pt shared that she completed a depression screening and apparently scored so high that she was not able to leave the office until her husband picked her up and agreed to bring her to [facility]. Pt provided counselor with paperwork from the office, indicating the command to go straight to [facility], as well as a list of all the medications that pt has tried and/or are currently on to address her depression and anxiety. Pt states that she is "majorly depressed". She indicates that she's been like this for a couple of months. Pt reports that she lost 25 lbs in a couple of months due to not eating. PT indicates that she is unable to eat, due to her depression. She reports that she will drink a Boost or Ensure in the AM to take her medications and will just drink Gatorade or Powerade the rest of the day. Pt was accompanied by her husband, Gwendolyn [name], who verified all that pt was saying. Pt endorses SI with no plan. She denies HI/AVH.  Pt heavily endorses anhedonia with demonstrated vegetative symptoms, indicating that all she wants to do is sit on the couch. Diagnosis: MDD, recurrent episode, severe; Generalized Anxiety D/O, by hx  Estimated length of stay:  Discharge anticipated for today 04/21/15  New goal(s): to develop effective aftercare plan.   Additional Comments:  Patient and CSW reviewed pt's identified goals and treatment plan. Patient verbalized understanding and agreed to treatment plan. CSW reviewed [facility] "Discharge Process and Patient Involvement" Form. Pt verbalized understanding of information provided and signed form.    Review of initial/current patient goals per problem list:  1. Goal(s): Patient will participate in aftercare plan  Met: Yes  Target date: at discharge  As evidenced by: Patient will participate within aftercare plan AEB aftercare provider and housing plan at discharge being identified.  2/20: CSW assessing for appropriate referrals at this time. PCP-Dr. Gar Ponto.  2/23: Goal met. Patient plans to return home with outpatient services.   2. Goal (s): Patient will exhibit decreased depressive symptoms and suicidal ideations.  Met: Yes   Target date: at discharge  As evidenced by: Patient will utilize self rating of depression at 3 or below and demonstrate decreased signs of depression or be deemed stable for discharge by MD.  2/20: Pt rates depression as high. Passive SI at times. Denies HI/AVH. Able to contract for safety on the unit.  2/23: Goal met. Patient reports baseline level of depression, denies SI. Reports feeling safe to return home today.    Attendees: Patient:    Family:    Physician: Dr. Sabra Heck 04/21/2015 9:30 AM  Nursing: Mayra Neer, Grayland Ormond,  RN 04/21/2015 9:30 AM  Clinical Social Worker: Erasmo Downer Jamol Ginyard, LCSW 04/21/2015 9:30 AM  Other: Peri Maris, LCSWA; Kenton Vale, LCSW  04/21/2015 9:30 AM  Other:  04/21/2015 9:30 AM  Other:  Lars Pinks, Case Manager 04/21/2015 9:30 AM  Other: Samuel Jester, NP 04/21/2015 9:30 AM  Other:           Scribe for Treatment Team:   Tilden Fossa, [place] Worker [facility] [phone]

## 2015-04-21 NOTE — [facility] Suicide Risk Assessment (Signed)
[facility] Discharge Suicide Risk Assessment   Principal Problem: Bipolar 1 disorder, depressed, severe (HCC) Discharge Diagnoses:  Patient Active Problem List   Diagnosis Date Noted  . Marijuana abuse [F12.10]   . Opioid use disorder, moderate, dependence (HCC) [F11.20] 04/15/2015  . Bipolar disorder with depression (HCC) [F31.30] 04/15/2015  . Bipolar 1 disorder, depressed, severe (HCC) [F31.4] 04/15/2015  . Pre-operative cardiovascular examination [Z01.810] 08/25/2014  . H/O sinus tachycardia [Z86.79] 08/25/2014  . Tobacco abuse [Z72.0]   . Palpitations [R00.2]   . SVT (supraventricular tachycardia) (HCC) [I47.1]   . Bipolar disorder (HCC) [F31.9]   . Depression [F32.9]   . Migraines [G43.909]   . GERD (gastroesophageal reflux disease) [K21.9]   . Ejection fraction [R09.89]     Total Time spent with patient: 20 minutes  Musculoskeletal: Strength & Muscle Tone: within normal limits Gait & Station: normal Patient leans: normal  Psychiatric Specialty Exam: Review of Systems  Psychiatric/Behavioral: Positive for depression.    Blood pressure 110/61, pulse 73, temperature 98 F (36.7 C), temperature source Oral, resp. rate 12, height 5' (1.524 m), weight 56.7 kg (125 lb), SpO2 100 %.Body mass index is 24.41 kg/(m^2).  General Appearance: Fairly Groomed  Patent attorney::  Fair  Speech:  Clear and Coherent409  Volume:  Normal  Mood:  Euthymic  Affect:  Appropriate  Thought Process:  Coherent and Goal Directed  Orientation:  Full (Time, Place, and Person)  Thought Content:  as above  Suicidal Thoughts:  No  Homicidal Thoughts:  No  Memory:  Immediate;   Fair Recent;   Fair Remote;   Fair  Judgement:  Fair  Insight:  Fair  Psychomotor Activity:  Normal  Concentration:  Fair  Recall:  [employer] of Knowledge:Fair  Language: Fair  Akathisia:  No  Handed:  Right  AIMS (if indicated):     Assets:  Desire for Improvement Housing Social Support  Sleep:  Number of Hours: 5.25   Cognition: WNL  ADL's:  Intact  In full contact with reality. There are no active SI plans or intent. Her mood is euthymic. Affect is appropriate she is willing and motivated to pursue further outpatient therapy Mental Status Per Nursing Assessment::   On Admission:     Demographic Factors:  Caucasian  Loss Factors: none identified  Historical Factors: none identified  Risk Reduction Factors:   Responsible for children under 62 years of age, Sense of responsibility to family, Living with another person, especially a relative and Positive social support  Continued Clinical Symptoms:  Bipolar Disorder:   Depressive phase  Cognitive Features That Contribute To Risk:  None    Suicide Risk:  Minimal: No identifiable suicidal ideation.  Patients presenting with no risk factors but with morbid ruminations; may be classified as minimal risk based on the severity of the depressive symptoms  Follow-up Information    [facility] with [facility] On 04/25/2015.   Why:  Assessment for therapy and medication management services on Monday February 27th at 10:30am. Please arrive at 10am to complete new patient paperwork and bring your medicaid card/ID to appt. Call office if you need to reschedule.   Contact information:   [address] [place], [place]  [address] Ph:  [phone]   (fax) [phone]      Plan Of Care/Follow-up recommendations:  Activity:  as tolerated Diet:  regular  Follow up as above  Gwendolyn [name] A, MD 04/21/2015, 9:48 AM

## 2015-04-21 NOTE — Progress Notes (Signed)
Discharge note:  Patient discharged home per MD order.  Patient received all personal belongings from unit and room.  Reviewed discharge instructions/medications/prescriptions and follow up appointments with patient.  She indicated understanding.  She denies SI/HI/AVH.  She states, "it was nice to break from my 6 kids."  Patient left ambulatory with her husband.

## 2015-04-21 NOTE — Progress Notes (Signed)
  [facility] Adult Case Management Discharge Plan :  Will you be returning to the same living situation after discharge:  Yes,  patient plans to return home with husband At discharge, do you have transportation home?: Yes,  husband will transport Do you have the ability to pay for your medications: Yes,  patient provided with prescriptions at discharge  Release of information consent forms completed and in the chart;  Patient's signature needed at discharge.  Patient to Follow up at: Follow-up Information    [facility] up with [facility] On 04/25/2015.   Why:  Assessment for therapy and medication management services on Monday February 27th at 10:30am. Please arrive at 10am to complete new patient paperwork and bring your medicaid card/ID to appt. Call office if you need to reschedule.   Contact information:   [address], [address] [place], [place]  [address] Ph:  [phone]   (fax) [phone]      Next level of care provider has access to [facility] Link:no  Safety Planning and Suicide Prevention discussed: Yes,  with patient and husband  Have you used any form of tobacco in the last 30 days? (Cigarettes, Smokeless Tobacco, Cigars, and/or Pipes): Yes  Has patient been referred to the Quitline?: Patient refused referral  Patient has been referred for addiction treatment: N/A  Azuree Minish, [place] 04/21/2015, 10:50 AM

## 2015-12-28 NOTE — Progress Notes (Deleted)
Cardiology Office Note  Date: 12/28/2015   ID: Gwendolyn [name], DOB 1980-05-18, MRN [id number]  PCP: Donzetta Sprung, MD  Evaluating Cardiologist: Nona Dell, MD   No chief complaint on file.   History of Present Illness: Gwendolyn [name] is a 35 y.o. female former patient of Dr. Myrtis Ser, last seen in June 2016. She is now establishing follow-up with me.  Past Medical History:  Diagnosis Date  . Bipolar disorder (HCC)   . Depression   . Ejection fraction   . GERD (gastroesophageal reflux disease)   . Migraines   . Palpitations   . SVT (supraventricular tachycardia) (HCC)   . Tobacco abuse     Past Surgical History:  Procedure Laterality Date  . ABDOMINAL HYSTERECTOMY    . CHOLECYSTECTOMY    . TUBAL LIGATION      Current Outpatient Prescriptions  Medication Sig Dispense Refill  . carbamazepine (EQUETRO) 200 MG CP12 12 hr capsule Take 1 capsule (200 mg total) by mouth 2 (two) times daily. For mood stabilization 60 each 0  . diazepam (VALIUM) 5 MG tablet Take 1 tablet (5 mg total) by mouth 3 (three) times daily as needed for anxiety. 1 tablet 0  . diltiazem (DILACOR XR) 240 MG 24 hr capsule Take 1 capsule (240 mg total) by mouth daily. For high blood pressure 1 capsule 0  . hydrOXYzine (ATARAX/VISTARIL) 50 MG tablet Take 1 tablet (50 mg total) by mouth every 6 (six) hours as needed for anxiety. 60 tablet 0  . metroNIDAZOLE (FLAGYL) 500 MG tablet Take 1 tablet (500 mg total) by mouth every 12 (twelve) hours. For yeast infection 12 tablet 0  . mirtazapine (REMERON) 30 MG tablet Take 1 tablet (30 mg total) by mouth at bedtime. For depression/insomnia 30 tablet 0  . omeprazole (PRILOSEC) 20 MG capsule Take 1 capsule (20 mg total) by mouth daily. For acid reflux     No current facility-administered medications for this visit.    Allergies:  Amoxicillin   Social History: The patient  reports that she has been smoking Cigarettes.  She has a 10.00 pack-year smoking history. She has  never used smokeless tobacco. She reports that she drinks alcohol. She reports that she uses drugs, including Marijuana.   Family History: [name] family history includes Heart disease in her father and other.   ROS:  Please see the history of present illness. Otherwise, complete review of systems is positive for {NONE DEFAULTED:18576::"none"}.  All other systems are reviewed and negative.   Physical Exam: VS:  LMP  (LMP Unknown) , BMI There is no height or weight on file to calculate BMI.  Wt Readings from Last 3 Encounters:  08/25/14 144 lb (65.3 kg)  01/12/13 133 lb (60.3 kg)  01/02/13 132 lb (59.9 kg)    General: Patient appears comfortable at rest. HEENT: Conjunctiva and lids normal, oropharynx clear with moist mucosa. Neck: Supple, no elevated JVP or carotid bruits, no thyromegaly. Lungs: Clear to auscultation, nonlabored breathing at rest. Cardiac: Regular rate and rhythm, no S3 or significant systolic murmur, no pericardial rub. Abdomen: Soft, nontender, no hepatomegaly, bowel sounds present, no guarding or rebound. Extremities: No pitting edema, distal pulses 2+. Skin: Warm and dry. Musculoskeletal: No kyphosis. Neuropsychiatric: Alert and oriented x3, affect grossly appropriate.  ECG: I personally reviewed the tracing from 08/25/2014 which showed normal sinus rhythm.  Recent Labwork: 04/14/2015: ALT 11; AST 15; BUN 6; Creatinine, Ser 0.80; Hemoglobin 13.7; Platelets 199; Potassium 3.6; Sodium 140  Other Studies Reviewed Today:  Echocardiogram 01/08/2013: Study Conclusions  - Left ventricle: The cavity size was normal. Wall thickness was normal. Systolic function was normal. The estimated ejection fraction was in the range of 60% to 65%. Wall motion was normal; there were no regional wall motion abnormalities. Left ventricular diastolic function parameters were normal. - Right atrium: Central venous pressure: 8mm Hg (est). - Tricuspid valve: Trivial  regurgitation. - Pulmonary arteries: PA peak pressure: 29mm Hg (S). - Pericardium, extracardiac: There was no pericardial effusion. Impressions:  - No prior study for comparison. Normal LV wall thickness with LVEF 60-65%, normal diastolic function. Trivial tricuspid regurgitation with PASP 29 mmHg.  Assessment and Plan:    Current medicines were reviewed with the patient today.  No orders of the defined types were placed in this encounter.   Disposition:  Signed, Jonelle Sidle, MD, [facility] 12/28/2015 2:34 PM    [facility] at [facility] [address] [place], [place], [place] [address] Phone: [phone]; Fax: [phone]

## 2015-12-30 ENCOUNTER — Ambulatory Visit: Payer: Self-pay | Admitting: Cardiology

## 2016-01-04 ENCOUNTER — Encounter: Payer: Self-pay | Admitting: Cardiology

## 2016-01-04 NOTE — Progress Notes (Deleted)
Cardiology Office Note  Date: 01/04/2016   ID: CARREY VITTITOE, DOB 19-Apr-1980, MRN [id number]  PCP: Donzetta Sprung, MD  Evaluating Cardiologist: Nona Dell, MD   No chief complaint on file.   History of Present Illness: SEMONE SCHERER is a 35 y.o. female former patient Dr. Myrtis Ser, last seen in June 2016, now presenting to establish follow-up with me. I reviewed her records and updated the chart.  Past Medical History:  Diagnosis Date  . Bipolar disorder (HCC)   . Depression   . GERD (gastroesophageal reflux disease)   . Migraines   . Palpitations   . SVT (supraventricular tachycardia) (HCC)     Past Surgical History:  Procedure Laterality Date  . ABDOMINAL HYSTERECTOMY    . CHOLECYSTECTOMY    . TUBAL LIGATION      Current Outpatient Prescriptions  Medication Sig Dispense Refill  . carbamazepine (EQUETRO) 200 MG CP12 12 hr capsule Take 1 capsule (200 mg total) by mouth 2 (two) times daily. For mood stabilization 60 each 0  . diazepam (VALIUM) 5 MG tablet Take 1 tablet (5 mg total) by mouth 3 (three) times daily as needed for anxiety. 1 tablet 0  . diltiazem (DILACOR XR) 240 MG 24 hr capsule Take 1 capsule (240 mg total) by mouth daily. For high blood pressure 1 capsule 0  . hydrOXYzine (ATARAX/VISTARIL) 50 MG tablet Take 1 tablet (50 mg total) by mouth every 6 (six) hours as needed for anxiety. 60 tablet 0  . metroNIDAZOLE (FLAGYL) 500 MG tablet Take 1 tablet (500 mg total) by mouth every 12 (twelve) hours. For yeast infection 12 tablet 0  . mirtazapine (REMERON) 30 MG tablet Take 1 tablet (30 mg total) by mouth at bedtime. For depression/insomnia 30 tablet 0  . omeprazole (PRILOSEC) 20 MG capsule Take 1 capsule (20 mg total) by mouth daily. For acid reflux     No current facility-administered medications for this visit.    Allergies:  Amoxicillin   Social History: The patient  reports that she has been smoking Cigarettes.  She has a 10.00 pack-year smoking history.  She has never used smokeless tobacco. She reports that she drinks alcohol. She reports that she uses drugs, including Marijuana.   Family History: [name] family history includes Heart disease in her father and other.   ROS:  Please see the history of present illness. Otherwise, complete review of systems is positive for {NONE DEFAULTED:18576::"none"}.  All other systems are reviewed and negative.   Physical Exam: VS:  LMP  (LMP Unknown) , BMI There is no height or weight on file to calculate BMI.  Wt Readings from Last 3 Encounters:  08/25/14 144 lb (65.3 kg)  01/12/13 133 lb (60.3 kg)  01/02/13 132 lb (59.9 kg)    General: Patient appears comfortable at rest. HEENT: Conjunctiva and lids normal, oropharynx clear with moist mucosa. Neck: Supple, no elevated JVP or carotid bruits, no thyromegaly. Lungs: Clear to auscultation, nonlabored breathing at rest. Cardiac: Regular rate and rhythm, no S3 or significant systolic murmur, no pericardial rub. Abdomen: Soft, nontender, no hepatomegaly, bowel sounds present, no guarding or rebound. Extremities: No pitting edema, distal pulses 2+. Skin: Warm and dry. Musculoskeletal: No kyphosis. Neuropsychiatric: Alert and oriented x3, affect grossly appropriate.  ECG: I personally reviewed the tracing from 08/25/2014 which showed normal sinus rhythm.  Recent Labwork: 04/14/2015: ALT 11; AST 15; BUN 6; Creatinine, Ser 0.80; Hemoglobin 13.7; Platelets 199; Potassium 3.6; Sodium 140   Other Studies  Reviewed Today:  Echocardiogram 01/08/2013: Study Conclusions  - Left ventricle: The cavity size was normal. Wall thickness was normal. Systolic function was normal. The estimated ejection fraction was in the range of 60% to 65%. Wall motion was normal; there were no regional wall motion abnormalities. Left ventricular diastolic function parameters were normal. - Right atrium: Central venous pressure: 8mm Hg (est). - Tricuspid valve:  Trivial regurgitation. - Pulmonary arteries: PA peak pressure: 29mm Hg (S). - Pericardium, extracardiac: There was no pericardial effusion. Impressions:  - No prior study for comparison. Normal LV wall thickness with LVEF 60-65%, normal diastolic function. Trivial tricuspid regurgitation with PASP 29 mmHg.  Assessment and Plan:    Current medicines were reviewed with the patient today.  No orders of the defined types were placed in this encounter.   Disposition:  Signed, Jonelle Sidle, MD, [facility] 01/04/2016 12:10 PM    [facility] at [facility] [address] [place], [place], [place] [address] Phone: [phone]; Fax: [phone]

## 2016-01-05 ENCOUNTER — Ambulatory Visit: Payer: Self-pay | Admitting: Cardiology

## 2016-01-05 ENCOUNTER — Encounter: Payer: Self-pay | Admitting: Cardiology

## 2016-12-07 ENCOUNTER — Ambulatory Visit: Payer: Self-pay | Admitting: Cardiovascular Disease

## 2016-12-10 ENCOUNTER — Encounter: Payer: Self-pay | Admitting: Cardiovascular Disease

## 2016-12-10 ENCOUNTER — Ambulatory Visit (INDEPENDENT_AMBULATORY_CARE_PROVIDER_SITE_OTHER): Payer: Medicaid Other | Admitting: Cardiovascular Disease

## 2016-12-10 VITALS — BP 98/68 | HR 96 | Ht 60.0 in | Wt 134.0 lb

## 2016-12-10 DIAGNOSIS — Z01818 Encounter for other preprocedural examination: Secondary | ICD-10-CM

## 2016-12-10 DIAGNOSIS — I471 Supraventricular tachycardia: Secondary | ICD-10-CM

## 2016-12-10 NOTE — Progress Notes (Signed)
SUBJECTIVE: The patient presents for follow-up of supraventricular tachycardia and sinus tachycardia. She is also here for preoperative clearance prior to undergoing dental extraction. She has a history of supraventricular tachycardia in the remote past. She was last seen by Dr. Myrtis Ser in June 2016. Echocardiogram in 2014 demonstrated normal left ventricular systolic function, LVEF 60-65%.  She is doing well. She experiences palpitations once or twice every 6 months. She uses Valsalva maneuvers and also puts Valium under her tongue. Both alleviate her symptoms. She does admit to a history of significant panic attacks.  She has occasional retrosternal chest pains provoked by coughing. She also associates them with anxiety. She denies shortness of breath. She has had some posterior neck pain and cramping.  She also describes bilateral ankle swelling at the end of the day. She noticed this 6 months ago.  She is soon to undergo extraction of 4 teeth.  ECG performed in the office today which I ordered and personally interpreted demonstrates normal sinus rhythm with no ischemic ST segment or T-wave abnormalities, nor any arrhythmias.    Review of Systems: As per "subjective", otherwise negative.  Allergies  Allergen Reactions  . Amoxicillin Shortness Of Breath, Swelling and Rash    Has patient had a PCN reaction causing immediate rash, facial/tongue/throat swelling, SOB or lightheadedness with hypotension: yes Has patient had a PCN reaction causing severe rash involving mucus membranes or skin necrosis: no Has patient had a PCN reaction that required hospitalization no Has patient had a PCN reaction occurring within the last 10 years: yes If all of the above answers are "NO", then may proceed with Cephalosporin use.    Current Outpatient Prescriptions  Medication Sig Dispense Refill  . diazepam (VALIUM) 10 MG tablet Take 10 mg by mouth 3 (three) times daily.    Marland Kitchen diltiazem (DILACOR XR)  240 MG 24 hr capsule Take 1 capsule (240 mg total) by mouth daily. For high blood pressure 1 capsule 0  . mirtazapine (REMERON) 45 MG tablet Take 45 mg by mouth at bedtime.    Marland Kitchen omeprazole (PRILOSEC) 20 MG capsule Take 1 capsule (20 mg total) by mouth daily. For acid reflux     No current facility-administered medications for this visit.     Past Medical History:  Diagnosis Date  . Bipolar disorder (HCC)   . Depression   . GERD (gastroesophageal reflux disease)   . Migraines   . Palpitations   . SVT (supraventricular tachycardia) (HCC)     Past Surgical History:  Procedure Laterality Date  . ABDOMINAL HYSTERECTOMY    . CHOLECYSTECTOMY    . TUBAL LIGATION      Social History   Social History  . Marital status: Married    Spouse name: N/A  . Number of children: N/A  . Years of education: N/A   Occupational History  . Disabled    Social History Main Topics  . Smoking status: Current Every Day Smoker    Packs/day: 1.00    Years: 10.00    Types: Cigarettes  . Smokeless tobacco: Never Used  . Alcohol use 0.0 oz/week     Comment: occasionally  . Drug use: Yes    Types: Marijuana  . Sexual activity: Yes    Birth control/ protection: Surgical   Other Topics Concern  . Not on file   Social History Narrative   Married   No regular exercise     Vitals:   12/10/16 1344  BP: 98/68  Pulse: 96  SpO2: 98%  Weight: 134 lb (60.8 kg)  Height: 5' (1.524 m)    Wt Readings from Last 3 Encounters:  12/10/16 134 lb (60.8 kg)  08/25/14 144 lb (65.3 kg)  01/12/13 133 lb (60.3 kg)     PHYSICAL EXAM General: NAD HEENT: Normal. Neck: No JVD, no thyromegaly. Lungs: Clear to auscultation bilaterally with normal respiratory effort. CV: Nondisplaced PMI.  Regular rate and rhythm, normal S1/S2, no S3/S4, no murmur. No pretibial or periankle edema.  No carotid bruit.   Abdomen: Soft, nontender, no distention.  Neurologic: Alert and oriented.  Psych: Normal affect. Skin:  Normal. Musculoskeletal: No gross deformities.    ECG: Most recent ECG reviewed.   Labs: Lab Results  Component Value Date/Time   K 3.6 04/14/2015 08:57 PM   BUN 6 04/14/2015 08:57 PM   CREATININE 0.80 04/14/2015 08:57 PM   ALT 11 (L) 04/14/2015 08:57 PM   HGB 13.7 04/14/2015 08:57 PM     Lipids: No results found for: LDLCALC, LDLDIRECT, CHOL, TRIG, HDL     ASSESSMENT AND PLAN: 1. Paroxysmal supraventricular tachycardia and sinus tachycardia: Symptomatically stable on long-acting diltiazem 240 mg daily. No changes to therapy.  2. Preoperative risk stratification: She is on a low risk for a major adverse cardiac event in the perioperative period. No noninvasive imaging is indicated at this time.      Disposition: Follow up 1 year.   Prentice Docker, M.D., F.A.C.C.

## 2016-12-10 NOTE — Patient Instructions (Signed)

## 2018-01-16 ENCOUNTER — Ambulatory Visit: Payer: Self-pay | Admitting: Cardiovascular Disease

## 2018-02-11 ENCOUNTER — Ambulatory Visit: Payer: Medicaid Other | Admitting: Cardiovascular Disease

## 2018-02-11 ENCOUNTER — Encounter: Payer: Self-pay | Admitting: Cardiovascular Disease

## 2018-02-11 ENCOUNTER — Encounter

## 2018-02-11 VITALS — BP 97/61 | HR 68 | Ht 60.0 in | Wt 132.8 lb

## 2018-02-11 DIAGNOSIS — R002 Palpitations: Secondary | ICD-10-CM

## 2018-02-11 DIAGNOSIS — I471 Supraventricular tachycardia: Secondary | ICD-10-CM | POA: Diagnosis not present

## 2018-02-11 NOTE — Patient Instructions (Addendum)
Medication Instructions:  Continue all current medications.  Labwork: none  Testing/Procedures: none  Follow-Up: As needed with Dr. Purvis SheffieldKoneswaran after seeing (EP)  Any Other Special Instructions Will Be Listed Below (If Applicable). You have been referred to:  [facility]  If you need a refill on your cardiac medications before your next appointment, please call your pharmacy.

## 2018-02-11 NOTE — Progress Notes (Signed)
SUBJECTIVE: The patient presents for routine follow-up.  She has a history of PSVT and sinus tachycardia.  Symptoms are typically alleviated with Valsalva maneuvers and Valium.  Echocardiogram in 2014 demonstrated normal left ventricular systolic function, LVEF 60-65%. She has a history of panic attacks.  ECG performed in the office today which I ordered and personally interpreted demonstrates normal sinus rhythm with nonspecific  T-wave abnormalities, and without arrhythmias.  She is here with her husband.  She experiences palpitations about 4-6 times per year.  She said she can tell the difference between panic attacks and when she goes into SVT.  Each episode last 5 to 10 minutes.  She become short of breath and gets sweaty.  She has not consumed caffeine in over 20 years.  Episodes can occur at rest and are sometimes triggered by coughing.  She is no longer able to be as active as she wants to be with her children due to this.     Review of Systems: As per "subjective", otherwise negative.  Allergies  Allergen Reactions  . Amoxicillin Shortness Of Breath, Swelling and Rash    Has patient had a PCN reaction causing immediate rash, facial/tongue/throat swelling, SOB or lightheadedness with hypotension: yes Has patient had a PCN reaction causing severe rash involving mucus membranes or skin necrosis: no Has patient had a PCN reaction that required hospitalization no Has patient had a PCN reaction occurring within the last 10 years: yes If all of the above answers are "NO", then may proceed with Cephalosporin use.    Current Outpatient Medications  Medication Sig Dispense Refill  . diazepam (VALIUM) 10 MG tablet Take 10 mg by mouth 3 (three) times daily.    Marland Kitchen. diltiazem (DILACOR XR) 240 MG 24 hr capsule Take 1 capsule (240 mg total) by mouth daily. For high blood pressure 1 capsule 0  . omeprazole (PRILOSEC) 20 MG capsule Take 1 capsule (20 mg total) by mouth daily. For  acid reflux     No current facility-administered medications for this visit.     Past Medical History:  Diagnosis Date  . Bipolar disorder (HCC)   . Depression   . GERD (gastroesophageal reflux disease)   . Migraines   . Palpitations   . SVT (supraventricular tachycardia) (HCC)     Past Surgical History:  Procedure Laterality Date  . ABDOMINAL HYSTERECTOMY    . CHOLECYSTECTOMY    . TUBAL LIGATION      Social History   Socioeconomic History  . Marital status: Married    Spouse name: Not on file  . Number of children: Not on file  . Years of education: Not on file  . Highest education level: Not on file  Occupational History  . Occupation: Disabled  Social Needs  . Financial resource strain: Not on file  . Food insecurity:    Worry: Not on file    Inability: Not on file  . Transportation needs:    Medical: Not on file    Non-medical: Not on file  Tobacco Use  . Smoking status: Current Every Day Smoker    Packs/day: 1.00    Years: 10.00    Pack years: 10.00    Types: Cigarettes  . Smokeless tobacco: Never Used  Substance and Sexual Activity  . Alcohol use: Yes    Alcohol/week: 0.0 standard drinks    Comment: occasionally  . Drug use: Yes    Types: Marijuana  . Sexual activity: Yes  Birth control/protection: Surgical  Lifestyle  . Physical activity:    Days per week: Not on file    Minutes per session: Not on file  . Stress: Not on file  Relationships  . Social connections:    Talks on phone: Not on file    Gets together: Not on file    Attends religious service: Not on file    Active member of club or organization: Not on file    Attends meetings of clubs or organizations: Not on file    Relationship status: Not on file  . Intimate partner violence:    Fear of current or ex partner: Not on file    Emotionally abused: Not on file    Physically abused: Not on file    Forced sexual activity: Not on file  Other Topics Concern  . Not on file    Social History Narrative   Married   No regular exercise     Vitals:   02/11/18 0821  BP: 97/61  Pulse: 68  Weight: 132 lb 12.8 oz (60.2 kg)  Height: 5' (1.524 m)    Wt Readings from Last 3 Encounters:  02/11/18 132 lb 12.8 oz (60.2 kg)  12/10/16 134 lb (60.8 kg)  08/25/14 144 lb (65.3 kg)     PHYSICAL EXAM General: NAD HEENT: Normal. Neck: No JVD, no thyromegaly. Lungs: Clear to auscultation bilaterally with normal respiratory effort. CV: Regular rate and rhythm, normal S1/S2, no S3/S4, no murmur. No pretibial or periankle edema.  No carotid bruit.   Abdomen: Soft, nontender, no distention.  Neurologic: Alert and oriented.  Psych: Normal affect. Skin: Normal. Musculoskeletal: No gross deformities.    ECG: Reviewed above under Subjective   Labs: Lab Results  Component Value Date/Time   K 3.6 04/14/2015 08:57 PM   BUN 6 04/14/2015 08:57 PM   CREATININE 0.80 04/14/2015 08:57 PM   ALT 11 (L) 04/14/2015 08:57 PM   HGB 13.7 04/14/2015 08:57 PM     Lipids: No results found for: LDLCALC, LDLDIRECT, CHOL, TRIG, HDL     ASSESSMENT AND PLAN:  1. Paroxysmal supraventricular tachycardia and sinus tachycardia/palpitations: She has had increasing frequency of symptoms on long-acting diltiazem 240 mg daily.  Blood pressure is low normal so I am unable to increase the dosage.  We talked at length about the possibility of radiofrequency ablation and she is interested in this.  I will arrange for EP consultation with Dr. Johney Frame for further discussion.   Disposition: Follow up with EP.  Follow-up with me as needed.   Prentice Docker, M.D., F.A.C.C.

## 2018-03-11 ENCOUNTER — Encounter: Payer: Self-pay | Admitting: Internal Medicine

## 2018-03-12 ENCOUNTER — Encounter: Payer: Self-pay | Admitting: Internal Medicine

## 2018-03-12 ENCOUNTER — Telehealth: Payer: Self-pay | Admitting: Internal Medicine

## 2018-03-12 ENCOUNTER — Ambulatory Visit (INDEPENDENT_AMBULATORY_CARE_PROVIDER_SITE_OTHER): Payer: Medicaid Other

## 2018-03-12 ENCOUNTER — Ambulatory Visit: Payer: Medicaid Other | Admitting: Internal Medicine

## 2018-03-12 VITALS — BP 98/60 | HR 61 | Ht 60.0 in | Wt 127.6 lb

## 2018-03-12 DIAGNOSIS — R002 Palpitations: Secondary | ICD-10-CM

## 2018-03-12 DIAGNOSIS — R000 Tachycardia, unspecified: Secondary | ICD-10-CM | POA: Diagnosis not present

## 2018-03-12 NOTE — Progress Notes (Signed)
Electrophysiology Office Note Date: 03/12/2018  ID:  Gwendolyn [name], DOB [birth year], MRN [id number]  PCP: Richardean Chimera, MD Primary Cardiologist: Dr Purvis Sheffield Electrophysiologist: Dr Johney Frame  CC: Evaluation for tachycardia/SVT  Gwendolyn [name] is a 38 y.o. female seen today at the request of Dr Purvis Sheffield for the evaluation of SVT. She has a PMH significant for bipolar disorder, anxiety and depression. She reports that she was first diagnosed with SVT when she was 38 years old after an extensive workup for her symptoms of lightheadedness, shortness of breath and sweating. Per patient, EMS documented SVT at that time and records are at [facility]. She has continued to have 4-5 episodes per year which usually terminate with Valsalva and valium. Patient reports that she has been given adenosine for episodes which did not terminate with maneuvers. She denies family history of sudden cardiac death.  She denies chest pain, PND, orthopnea, nausea, vomiting, syncope, edema, weight gain, or early satiety.  Past Medical History:  Diagnosis Date  . Bipolar disorder (HCC)   . Depression   . GERD (gastroesophageal reflux disease)   . Migraines   . Palpitations   . SVT (supraventricular tachycardia) (HCC)    Past Surgical History:  Procedure Laterality Date  . ABDOMINAL HYSTERECTOMY    . CHOLECYSTECTOMY    . TUBAL LIGATION      Current Outpatient Medications  Medication Sig Dispense Refill  . diazepam (VALIUM) 10 MG tablet Take 10 mg by mouth 3 (three) times daily.    Marland Kitchen diltiazem (DILACOR XR) 240 MG 24 hr capsule Take 1 capsule (240 mg total) by mouth daily. For high blood pressure 1 capsule 0  . mirtazapine (REMERON) 45 MG tablet Take 45 mg by mouth at bedtime.    Marland Kitchen omeprazole (PRILOSEC) 20 MG capsule Take 1 capsule (20 mg total) by mouth daily. For acid reflux     No current facility-administered medications for this visit.     Allergies:   Amoxicillin   Social  History: Social History   Socioeconomic History  . Marital status: Married    Spouse name: Not on file  . Number of children: Not on file  . Years of education: Not on file  . Highest education level: Not on file  Occupational History  . Occupation: Disabled  Social Needs  . Financial resource strain: Not on file  . Food insecurity:    Worry: Not on file    Inability: Not on file  . Transportation needs:    Medical: Not on file    Non-medical: Not on file  Tobacco Use  . Smoking status: Current Every Day Smoker    Packs/day: 1.00    Years: 10.00    Pack years: 10.00    Types: Cigarettes  . Smokeless tobacco: Never Used  Substance and Sexual Activity  . Alcohol use: Yes    Alcohol/week: 0.0 standard drinks    Comment: occasionally  . Drug use: Yes    Types: Marijuana  . Sexual activity: Yes    Birth control/protection: Surgical  Lifestyle  . Physical activity:    Days per week: Not on file    Minutes per session: Not on file  . Stress: Not on file  Relationships  . Social connections:    Talks on phone: Not on file    Gets together: Not on file    Attends religious service: Not on file    Active member of club or organization: Not on file  Attends meetings of clubs or organizations: Not on file    Relationship status: Not on file  . Intimate partner violence:    Fear of current or ex partner: Not on file    Emotionally abused: Not on file    Physically abused: Not on file    Forced sexual activity: Not on file  Other Topics Concern  . Not on file  Social History Narrative   Married   No regular exercise    Family History: Family History  Problem Relation Age of Onset  . Heart disease Father   . Heart disease Other     Review of Systems: All other systems reviewed and are otherwise negative except as noted above.   Physical Exam: VS:  BP 98/60   Pulse 61   Ht 5' (1.524 m)   Wt 127 lb 9.6 oz (57.9 kg)   LMP  (LMP Unknown)   SpO2 99%   BMI  24.92 kg/m  , BMI Body mass index is 24.92 kg/m. Wt Readings from Last 3 Encounters:  03/12/18 127 lb 9.6 oz (57.9 kg)  02/11/18 132 lb 12.8 oz (60.2 kg)  12/10/16 134 lb (60.8 kg)    GEN- The patient is well appearing, alert and oriented x 3 today.   HEENT: normocephalic, atraumatic; sclera clear, conjunctiva pink; hearing intact; oropharynx clear; neck supple, no JVP Lymph- no cervical lymphadenopathy Lungs- Clear to ausculation bilaterally, normal work of breathing.  No wheezes, rales, rhonchi Heart- Regular rate and rhythm, no murmurs, rubs or gallops, PMI not laterally displaced GI- soft, non-tender, non-distended, bowel sounds present, no hepatosplenomegaly Extremities- no clubbing, cyanosis, or edema; DP/PT/radial pulses 2+ bilaterally MS- no significant deformity or atrophy Skin- warm and dry, no rash or lesion  Psych- euthymic mood, full affect Neuro- strength and sensation are intact   EKG:  EKG is ordered today. The ekg ordered today shows SR HR 61, PR 136, QRS 90, QTc 412   Other studies Reviewed: Additional studies/ records that were reviewed today include: Epic notes  Assessment and Plan:  1. tachycardia Patient continues to have heart racing symptoms on diltiazem. BP limiting therapy.  I think that it is important to document tachycardia before deciding therapy. Will request records from [facility] and order 30-day event monitor to further characterize her tachypalpitations.  We discussed various therapeutic options including EP study and SVT ablation. Will hold on procedure until we have documented SVT. Stop diltiazem 2/2 hypotension and fatigue   Current medicines are reviewed at length with the patient today.   The patient does not have concerns regarding her medicines.  The following changes were made today:  Stop diltiazem  Labs/ tests ordered today include:  Orders Placed This Encounter  Procedures  . EKG 12-Lead     Disposition:    Follow up with me in 6-7 weeks. 30-day event monitor.   Randolm Idol MD 03/12/2018 12:24 PM   [facility] [address] [place] [address] [phone] (office) [phone] (fax)

## 2018-03-12 NOTE — Telephone Encounter (Signed)
Medical records requested from [facility]. 03/12/18 vlm

## 2018-03-12 NOTE — Patient Instructions (Addendum)
Medication Instructions:  Your physician has recommended you make the following change in your medication:   1.  Stop taking diltiazem  Labwork: None ordered.  Testing/Procedures: Your physician has recommended that you wear an event monitor. Event monitors are medical devices that record the heart's electrical activity. Doctors most often [place] these monitors to diagnose arrhythmias. Arrhythmias are problems with the speed or rhythm of the heartbeat. The monitor is a small, portable device. You can wear one while you do your normal daily activities. This is usually used to diagnose what is causing palpitations/syncope (passing out).  Please schedule for a 30 day event monitor.  Follow-Up: Your physician wants you to follow-up in:  7 weeks with Dr. Johney Frame (office visit)  Any Other Special Instructions Will Be Listed Below (If Applicable).  If you need a refill on your cardiac medications before your next appointment, please call your pharmacy.     [employer]  FDA-cleared EKG at your fingertips. - [employer]   [employer], [employer]. https://store.alivecor.com/products/kardiamobile   FDA-cleared, clinical grade mobile EKG monitor: Lourena Simmonds is the most clinically-validated mobile EKG used by the world's leading cardiac care medical professionals.  This may be useful in monitoring palpitations.  We do not have access to have them emailed and reviewed but will be glad to review while in the office.

## 2018-03-13 NOTE — Telephone Encounter (Signed)
Medical records received from [facility]. Pt has appt 04/28/18 @ 11a. vlm

## 2018-03-26 ENCOUNTER — Ambulatory Visit (INDEPENDENT_AMBULATORY_CARE_PROVIDER_SITE_OTHER): Payer: Medicaid Other

## 2018-03-26 ENCOUNTER — Telehealth: Payer: Self-pay | Admitting: Cardiovascular Disease

## 2018-03-26 ENCOUNTER — Ambulatory Visit (INDEPENDENT_AMBULATORY_CARE_PROVIDER_SITE_OTHER): Payer: Medicaid Other | Admitting: Orthopaedic Surgery

## 2018-03-26 ENCOUNTER — Encounter (INDEPENDENT_AMBULATORY_CARE_PROVIDER_SITE_OTHER): Payer: Self-pay | Admitting: Orthopaedic Surgery

## 2018-03-26 VITALS — BP 110/70 | HR 70 | Ht 60.0 in | Wt 127.0 lb

## 2018-03-26 DIAGNOSIS — G5601 Carpal tunnel syndrome, right upper limb: Secondary | ICD-10-CM | POA: Diagnosis not present
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
## 2018-03-26 NOTE — Telephone Encounter (Signed)
Dr. Norlene Campbell from [facility] called  Patient is being treated for carpal tunnel . Dr. Cleophas Dunker is wanting to give patient Prednisone. Wanted to verify with cardiology. Please call [phone]. Tamela Oddi)

## 2018-03-26 NOTE — Telephone Encounter (Signed)
Notified. 

## 2018-03-26 NOTE — Telephone Encounter (Signed)
That would be fine 

## 2018-03-26 NOTE — Progress Notes (Signed)
Office Visit Note   Patient: Gwendolyn [name]           Date of Birth: 06-Aug-1980           MRN: [id number] Visit Date: 03/26/2018              Requested by: Richardean Chimera, MD [address] [place], [place] [address] PCP: Richardean Chimera, MD   Assessment & Plan: Visit Diagnoses:  1. Carpal tunnel syndrome, right upper limb     Plan: His right hand and wrist seem to be consistent with carpal tunnel syndrome.  Also has a small radial and volar ganglion cyst which I do not think is symptomatic.Marland Kitchen  Keep out of work for a week and try Medrol Dosepak if okay with the cardiologist.  Patient is being followed followed and monitored for SVT.Marland Kitchen  Follow-Up Instructions: Return in about 1 week (around 04/02/2018).   Orders:  No orders of the defined types were placed in this encounter.  No orders of the defined types were placed in this encounter.     Procedures: No procedures performed   Clinical Data: No additional findings.   Subjective: Chief Complaint  Patient presents with  . Right Wrist - Pain  . Right Arm - Pain  Ms Gwendolyn [name] is 38 years old visited the office with a relatively acute onset of right wrist pain associated with numbness and tingling.  She first noted symptoms approximately a week ago but notes that in the past she has had some minor episodes of numbness and tingling.  She is also noted a mass on the volar aspect of her wrist and not sure if that is at all related to her present problem.  Pain seems to start in the volar aspect of her wrist with numbness and tingling and "all my fingers".  Pain will radiate as far proximally as the elbow.  No injury or trauma.  She does work as a Conservation officer, nature at [employer].  Has neck pain  HPI  Review of Systems   Objective: Vital Signs: BP 110/70   Pulse 70   Ht 5' (1.524 m)   Wt 127 lb (57.6 kg)   LMP  (LMP Unknown)   BMI 24.80 kg/m   Physical Exam Constitutional:      Appearance: She is well-developed.  Eyes:     Pupils: Pupils are  equal, round, and reactive to light.  Pulmonary:     Effort: Pulmonary effort is normal.  Skin:    General: Skin is warm and dry.  Neurological:     Mental Status: She is alert and oriented to person, place, and time.  Psychiatric:        Behavior: Behavior normal.     Ortho Exam awake alert and oriented x3.  Comfortable sitting.  Positive Tinel's and Phalen's about the right wrist.  No swelling of the fingers opposition of thumb little finger.  Has about a 1 cm of mass over the radial artery consistent with a ganglion cyst.  Freely mobile and nontender.  No pain with wrist motion.  Specialty Comments:  No specialty comments available.  Imaging: No results found.   PMFS History: Patient Active Problem List   Diagnosis Date Noted  . Marijuana abuse   . Opioid use disorder, moderate, dependence (HCC) 04/15/2015  . Bipolar disorder with depression (HCC) 04/15/2015  . Bipolar 1 disorder, depressed, severe (HCC) 04/15/2015  . Pre-operative cardiovascular examination 08/25/2014  . H/O sinus tachycardia 08/25/2014  .  Tobacco abuse   . Palpitations   . SVT (supraventricular tachycardia) (HCC)   . Bipolar disorder (HCC)   . Depression   . Migraines   . GERD (gastroesophageal reflux disease)   . Ejection fraction    Past Medical History:  Diagnosis Date  . Bipolar disorder (HCC)   . Depression   . GERD (gastroesophageal reflux disease)   . Migraines   . Palpitations   . SVT (supraventricular tachycardia) (HCC)     Family History  Problem Relation Age of Onset  . Heart disease Father   . Heart disease Other     Past Surgical History:  Procedure Laterality Date  . ABDOMINAL HYSTERECTOMY    . CHOLECYSTECTOMY    . TUBAL LIGATION     Social History   Occupational History  . Occupation: Disabled  Tobacco Use  . Smoking status: Current Every Day Smoker    Packs/day: 1.00    Years: 10.00    Pack years: 10.00    Types: Cigarettes  . Smokeless tobacco: Never Used    Substance and Sexual Activity  . Alcohol use: Yes    Alcohol/week: 0.0 standard drinks    Comment: occasionally  . Drug use: Yes    Types: Marijuana  . Sexual activity: Yes    Birth control/protection: Surgical     Valeria Batman, MD   Note - This record has been created using [employer].  Chart creation errors have been sought, but may not always  have been located. Such creation errors do not reflect on  the standard of medical care.

## 2018-03-27 ENCOUNTER — Telehealth (INDEPENDENT_AMBULATORY_CARE_PROVIDER_SITE_OTHER): Payer: Self-pay | Admitting: Radiology

## 2018-03-27 MED ORDER — METHYLPREDNISOLONE 4 MG PO TBPK: ORAL_TABLET | ORAL | 0 refills | Status: DC

## 2018-03-27 NOTE — Telephone Encounter (Signed)
Thanks-ok for 4mg  medrol dosepack

## 2018-03-27 NOTE — Telephone Encounter (Signed)
Gwendolyn [name] in [place] office received call yesterday afternoon from Dr. Sharene Skeans office stating it was fine for the patient to be prescribed Medrol Dosepak 4mg .  Medication sent to the pharmacy. I called patient and left voicemail advising.

## 2018-04-02 ENCOUNTER — Ambulatory Visit (INDEPENDENT_AMBULATORY_CARE_PROVIDER_SITE_OTHER): Payer: Medicaid Other | Admitting: Orthopaedic Surgery

## 2018-04-02 ENCOUNTER — Encounter (INDEPENDENT_AMBULATORY_CARE_PROVIDER_SITE_OTHER): Payer: Self-pay | Admitting: Orthopaedic Surgery

## 2018-04-02 VITALS — BP 103/68 | HR 86 | Ht 60.0 in | Wt 127.0 lb

## 2018-04-02 DIAGNOSIS — G5601 Carpal tunnel syndrome, right upper limb: Secondary | ICD-10-CM | POA: Diagnosis not present

## 2018-04-02 NOTE — Progress Notes (Signed)
Office Visit Note   Patient: Gwendolyn [name]           Date of Birth: [birth year]           MRN: [id number] Visit Date: 04/02/2018              Requested by: Richardean Chimera, MD [address] [place], [place] [address] PCP: Richardean Chimera, MD   Assessment & Plan: Visit Diagnoses:  1. Carpal tunnel syndrome, right upper limb     Plan: Seen last week for evaluation of right wrist pain.  I thought her symptoms were consistent with carpal tunnel.  I placed her in a volar wrist splint and a Medrol Dosepak.  She notes that she is feeling better with less pain but tickly when she wears the splint.  Not having nearly as much pain or tingling in the fingers.  Will order EMGs and nerve conduction studies as she has had acute pain for several weeks but has had some discomfort in her hand for for many months does have a volar wrist ganglion that I do not think is symptomatic but will evaluate that over time of necessary.  Continue out of work and use the splint  Follow-Up Instructions: Return after [facility].   Orders:  Orders Placed This Encounter  Procedures  . Ambulatory referral to Physical Medicine Rehab   No orders of the defined types were placed in this encounter.     Procedures: No procedures performed   Clinical Data: No additional findings.   Subjective: Chief Complaint  Patient presents with  . Right Wrist - Follow-up  Patient returns for follow up of right wrist pain. She has been wearing the velcro splint and has taken the Medrol Dosepak. She does not notice any change in her symptoms. Actually Mrs. Fluhr thinks she might be a little bit better in terms of her pain but tickly when she wears the splint.  Little bit of swelling at night versus the day.  Not quite as much numbness and tingling.  Had some chronic issues with her neck but there is no neck movement that creates the discomfort in her wrist or hand HPI  Review of Systems   Objective: Vital Signs: BP 103/68   Pulse 86    Ht 5' (1.524 m)   Wt 127 lb (57.6 kg)   LMP  (LMP Unknown)   BMI 24.80 kg/m   Physical Exam  Ortho Exam wake alert and oriented x3.  Comfortable sitting post thumb little finger.  No swelling of her fingers.  Does have rings on the index and long finger which have asked her to remove at home in case she does have increased swelling.  Minimal tenderness over the median nerve at the wrist.  Good capillary refill to the fingers.  Able to make a full fist and released.  Little bit of "tingling" in the fingers but has good sensibility  Specialty Comments:  No specialty comments available.  Imaging: No results found.   PMFS History: Patient Active Problem List   Diagnosis Date Noted  . Marijuana abuse   . Opioid use disorder, moderate, dependence (HCC) 04/15/2015  . Bipolar disorder with depression (HCC) 04/15/2015  . Bipolar 1 disorder, depressed, severe (HCC) 04/15/2015  . Pre-operative cardiovascular examination 08/25/2014  . H/O sinus tachycardia 08/25/2014  . Tobacco abuse   . Palpitations   . SVT (supraventricular tachycardia) (HCC)   . Bipolar disorder (HCC)   . Depression   .  Migraines   . GERD (gastroesophageal reflux disease)   . Ejection fraction    Past Medical History:  Diagnosis Date  . Bipolar disorder (HCC)   . Depression   . GERD (gastroesophageal reflux disease)   . Migraines   . Palpitations   . SVT (supraventricular tachycardia) (HCC)     Family History  Problem Relation Age of Onset  . Heart disease Father   . Heart disease Other     Past Surgical History:  Procedure Laterality Date  . ABDOMINAL HYSTERECTOMY    . CHOLECYSTECTOMY    . TUBAL LIGATION     Social History   Occupational History  . Occupation: Disabled  Tobacco Use  . Smoking status: Current Every Day Smoker    Packs/day: 1.00    Years: 10.00    Pack years: 10.00    Types: Cigarettes  . Smokeless tobacco: Never Used  Substance and Sexual Activity  . Alcohol use: Yes     Alcohol/week: 0.0 standard drinks    Comment: occasionally  . Drug use: Yes    Types: Marijuana  . Sexual activity: Yes    Birth control/protection: Surgical

## 2018-04-09 ENCOUNTER — Ambulatory Visit: Payer: Self-pay | Admitting: Cardiovascular Disease

## 2018-04-28 ENCOUNTER — Ambulatory Visit: Payer: Medicaid Other | Admitting: Internal Medicine

## 2018-04-29 ENCOUNTER — Encounter: Payer: Self-pay | Admitting: Internal Medicine

## 2018-05-02 ENCOUNTER — Encounter (INDEPENDENT_AMBULATORY_CARE_PROVIDER_SITE_OTHER): Payer: Medicaid Other | Admitting: Physical Medicine and Rehabilitation

## 2018-12-25 ENCOUNTER — Telehealth: Payer: Self-pay | Admitting: Obstetrics & Gynecology

## 2018-12-25 NOTE — Telephone Encounter (Signed)

## 2018-12-26 ENCOUNTER — Encounter: Payer: Medicaid Other | Admitting: Obstetrics & Gynecology

## 2019-01-12 ENCOUNTER — Telehealth: Payer: Self-pay | Admitting: Obstetrics & Gynecology

## 2019-01-12 NOTE — Telephone Encounter (Signed)

## 2019-01-13 ENCOUNTER — Ambulatory Visit (INDEPENDENT_AMBULATORY_CARE_PROVIDER_SITE_OTHER): Payer: Medicaid Other | Admitting: Obstetrics & Gynecology

## 2019-01-13 ENCOUNTER — Encounter: Payer: Self-pay | Admitting: Obstetrics & Gynecology

## 2019-01-13 ENCOUNTER — Other Ambulatory Visit: Payer: Self-pay

## 2019-01-13 ENCOUNTER — Encounter: Payer: Medicaid Other | Admitting: Obstetrics & Gynecology

## 2019-01-13 VITALS — BP 105/70 | HR 69 | Ht 60.0 in | Wt 120.0 lb

## 2019-01-13 DIAGNOSIS — N83202 Unspecified ovarian cyst, left side: Secondary | ICD-10-CM | POA: Diagnosis not present

## 2019-01-13 MED ORDER — MEGESTROL ACETATE 40 MG PO TABS: ORAL_TABLET | ORAL | 3 refills | Status: DC

## 2019-01-13 MED ORDER — PIROXICAM 20 MG PO CAPS: 20.0000 mg | ORAL_CAPSULE | Freq: Every day | ORAL | 3 refills | Status: DC

## 2019-01-13 NOTE — Progress Notes (Signed)
Patient ID: Gwendolyn [name], female   DOB: 03/10/1980, 38 y.o.   MRN: [id number]      Chief Complaint  Patient presents with  . Ovarian Cyst      38 y.o. No obstetric history on file. No LMP recorded (lmp unknown). Patient has had a hysterectomy. The current method of family planning is status post hysterectomy.  Outpatient Encounter Medications as of 01/13/2019  Medication Sig  . diazepam (VALIUM) 10 MG tablet Take 10 mg by mouth 3 (three) times daily.  Marland Kitchen diltiazem (CARDIZEM CD) 240 MG 24 hr capsule Take 240 mg by mouth daily.  . Galcanezumab-gnlm (EMGALITY) 120 MG/ML SOSY Inject 120 mg into the skin every 30 (thirty) days.   Marland Kitchen omeprazole (PRILOSEC) 20 MG capsule Take 1 capsule (20 mg total) by mouth daily. For acid reflux  . [DISCONTINUED] diltiazem (DILT-XR) 240 MG 24 hr capsule Take by mouth.  . [DISCONTINUED] ibuprofen (ADVIL) 800 MG tablet Take 800 mg by mouth as needed for mild pain.   . [DISCONTINUED] mirtazapine (REMERON) 45 MG tablet Take 45 mg by mouth at bedtime.  . megestrol (MEGACE) 40 MG tablet 1 tablet daily (Patient not taking: Reported on 04/20/2019)  . [DISCONTINUED] methylPREDNISolone (MEDROL) 4 MG TBPK tablet Take as directed.  . [DISCONTINUED] piroxicam (FELDENE) 20 MG capsule Take 1 capsule (20 mg total) by mouth daily.   No facility-administered encounter medications on file as of 01/13/2019.    Subjective Patient is in as a follow-up from [facility] She presented with pelvic pain severe on the left and was found to have a left ovarian cyst She was then referred here for management Since being seen her pain is continued maybe not quite as bad but has continued No nausea vomiting or diarrhea No fevers or chills No urinary symptoms Past Medical History:  Diagnosis Date  . Bipolar disorder (HCC)   . Depression   . GERD (gastroesophageal reflux disease)   . Migraines   . Palpitations   . SVT (supraventricular tachycardia) (HCC)     Past Surgical  History:  Procedure Laterality Date  . ABDOMINAL HYSTERECTOMY    . CHOLECYSTECTOMY    . TUBAL LIGATION      OB History    Gravida  6   Para  6   Term  6   Preterm      AB      Living  6     SAB      TAB      Ectopic      Multiple      Live Births  6           Allergies  Allergen Reactions  . Amoxicillin Shortness Of Breath, Swelling and Rash    Has patient had a PCN reaction causing immediate rash, facial/tongue/throat swelling, SOB or lightheadedness with hypotension: yes Has patient had a PCN reaction causing severe rash involving mucus membranes or skin necrosis: no Has patient had a PCN reaction that required hospitalization no Has patient had a PCN reaction occurring within the last 10 years: yes If all of the above answers are "NO", then may proceed with Cephalosporin use.    Social History   Socioeconomic History  . Marital status: Married    Spouse name: Not on file  . Number of children: Not on file  . Years of education: Not on file  . Highest education level: Not on file  Occupational History  . Occupation: Disabled  Tobacco Use  .  Smoking status: Current Every Day Smoker    Packs/day: 1.00    Years: 10.00    Pack years: 10.00    Types: Cigarettes  . Smokeless tobacco: Never Used  Substance and Sexual Activity  . Alcohol use: Not Currently    Alcohol/week: 0.0 standard drinks    Comment: occasionally  . Drug use: Not Currently    Types: Marijuana  . Sexual activity: Yes    Birth control/protection: Surgical    Comment: hyst  Other Topics Concern  . Not on file  Social History Narrative   Married   No regular exercise   Social Determinants of Health   Financial Resource Strain:   . Difficulty of Paying Living Expenses:   Food Insecurity:   . Worried About Programme researcher, broadcasting/film/video in the Last Year:   . Barista in the Last Year:   Transportation Needs:   . Freight forwarder (Medical):   Marland Kitchen Lack of Transportation  (Non-Medical):   Physical Activity:   . Days of Exercise per Week:   . Minutes of Exercise per Session:   Stress:   . Feeling of Stress :   Social Connections:   . Frequency of Communication with Friends and Family:   . Frequency of Social Gatherings with Friends and Family:   . Attends Religious Services:   . Active Member of Clubs or Organizations:   . Attends Banker Meetings:   Marland Kitchen Marital Status:     Family History  Problem Relation Age of Onset  . Heart disease Father   . Heart disease Other   . Other Son        fatty liver  . ADD / ADHD Son   . ADD / ADHD Son   . ADD / ADHD Daughter     Medications:       Current Outpatient Medications:  .  diazepam (VALIUM) 10 MG tablet, Take 10 mg by mouth 3 (three) times daily., Disp: , Rfl:  .  diltiazem (CARDIZEM CD) 240 MG 24 hr capsule, Take 240 mg by mouth daily., Disp: , Rfl:  .  Galcanezumab-gnlm (EMGALITY) 120 MG/ML SOSY, Inject 120 mg into the skin every 30 (thirty) days. , Disp: , Rfl:  .  omeprazole (PRILOSEC) 20 MG capsule, Take 1 capsule (20 mg total) by mouth daily. For acid reflux, Disp: , Rfl:  .  albuterol (VENTOLIN HFA) 108 (90 Base) MCG/ACT inhaler, Inhale 1-2 puffs into the lungs every 6 (six) hours as needed for wheezing or shortness of breath. , Disp: , Rfl:  .  clindamycin (CLEOCIN) 300 MG capsule, Take 1 capsule (300 mg total) by mouth 3 (three) times daily., Disp: 30 capsule, Rfl: 0 .  ketorolac (TORADOL) 10 MG tablet, Take 1 tablet (10 mg total) by mouth every 8 (eight) hours as needed., Disp: 15 tablet, Rfl: 0 .  ketorolac (TORADOL) 10 MG tablet, Take 1 tablet (10 mg total) by mouth every 8 (eight) hours as needed., Disp: 15 tablet, Rfl: 0 .  megestrol (MEGACE) 40 MG tablet, 1 tablet daily (Patient not taking: Reported on 04/20/2019), Disp: 30 tablet, Rfl: 3  Objective Blood pressure 105/70, pulse 69, height 5' (1.524 m), weight 120 lb (54.4 kg).  Gen WDWN NAD  Pertinent ROS No burning with  urination, frequency or urgency No nausea, vomiting or diarrhea Nor fever chills or other constitutional symptoms   Labs or studies Reviewed outside reports    Impression Diagnoses this Encounter::  ICD-10-CM   1. Cyst of left ovary  N83.202     Established relevant diagnosis(es):   Plan/Recommendations: Meds ordered this encounter  Medications  . megestrol (MEGACE) 40 MG tablet    Sig: 1 tablet daily    Dispense:  30 tablet    Refill:  3  . DISCONTD: piroxicam (FELDENE) 20 MG capsule    Sig: Take 1 capsule (20 mg total) by mouth daily.    Dispense:  30 capsule    Refill:  3    Labs or Scans Ordered: No orders of the defined types were placed in this encounter.   Management:: Megestrol therapy suppression along with nonsteroidal anti-inflammatory drug to manage her pain and hopefully cause the left ovarian cyst to resolve  Follow up Return in about 2 months (around 03/15/2019) for Follow up, with Dr Despina Hidden.      All questions were answered.

## 2019-01-26 ENCOUNTER — Telehealth: Payer: Self-pay | Admitting: Obstetrics & Gynecology

## 2019-01-26 ENCOUNTER — Telehealth: Payer: Self-pay | Admitting: *Deleted

## 2019-01-26 MED ORDER — HYDROCODONE-ACETAMINOPHEN 5-325 MG PO TABS: 1.0000 | ORAL_TABLET | Freq: Four times a day (QID) | ORAL | 0 refills | Status: DC | PRN

## 2019-01-26 NOTE — Telephone Encounter (Signed)
Pt sees Dr. Elonda Husky and was prescribed Megace and "something else." pt got the Megace but states the other med requires a prior auth. Pt wonders if the other med can be changed so she can get something quicker. Pt is in pain. Please advise. Thanks!! Gwendolyn [name]

## 2019-03-03 ENCOUNTER — Telehealth: Payer: Self-pay | Admitting: Internal Medicine

## 2019-03-03 ENCOUNTER — Telehealth: Payer: Self-pay

## 2019-03-03 ENCOUNTER — Telehealth: Payer: Self-pay | Admitting: Cardiovascular Disease

## 2019-03-03 NOTE — Telephone Encounter (Signed)
Reviewed I only see mildly elevated heart rates (108 bpm) with sinus tachycardia No arrhythmias  Boneta Lucks will call her tomorrow.

## 2019-03-03 NOTE — Telephone Encounter (Signed)
Patient was told by EMS last night to call cardiologist about EKG that was performed. Told her that it was pretty serious and needed to be addressed

## 2019-03-03 NOTE — Telephone Encounter (Signed)
I spoke to the patient who was wondering if Dr Johney Frame had a chance to review her MyChart message with her heart rhythm attached.  Please advise, thank you.

## 2019-03-03 NOTE — Telephone Encounter (Signed)
Patient is calling about the message she sent via Mychart, she would like a call back today.

## 2019-03-03 NOTE — Telephone Encounter (Signed)
I spoke to the patient and informed her that I could only see the interpretation and not the rhythm strip itself.  She will send over My Chart.

## 2019-03-03 NOTE — Telephone Encounter (Signed)
Message addressed by [facility] Staff in new encounter phone and mychart notes. See documents for details

## 2019-03-04 ENCOUNTER — Telehealth: Payer: Self-pay

## 2019-03-04 NOTE — Telephone Encounter (Signed)
Responded via MyChart.

## 2019-03-06 ENCOUNTER — Telehealth (INDEPENDENT_AMBULATORY_CARE_PROVIDER_SITE_OTHER): Payer: Medicaid Other | Admitting: Cardiovascular Disease

## 2019-03-06 ENCOUNTER — Encounter: Payer: Self-pay | Admitting: Cardiovascular Disease

## 2019-03-06 ENCOUNTER — Telehealth: Payer: Self-pay | Admitting: Cardiovascular Disease

## 2019-03-06 VITALS — HR 89 | Ht 60.0 in | Wt 124.0 lb

## 2019-03-06 DIAGNOSIS — I471 Supraventricular tachycardia: Secondary | ICD-10-CM

## 2019-03-06 DIAGNOSIS — R002 Palpitations: Secondary | ICD-10-CM

## 2019-03-06 DIAGNOSIS — R000 Tachycardia, unspecified: Secondary | ICD-10-CM

## 2019-03-06 NOTE — Telephone Encounter (Signed)
Virtual Visit Pre-Appointment Phone Call  "(Name), I am calling you today to discuss your upcoming appointment. We are currently trying to limit exposure to the virus that causes COVID-19 by seeing patients at home rather than in the office."  "What is the BEST phone number to call the day of the visit?" - [phone]  1. "Do you have or have access to (through a family member/friend) a smartphone with video capability that we can use for your visit?" a. If yes - list this number in appt notes as "cell" (if different from BEST phone #) and list the appointment type as a VIDEO visit in appointment notes b. If no - list the appointment type as a PHONE visit in appointment notes  2. Confirm consent - "In the setting of the current Covid19 crisis, you are scheduled for a (phone or video) visit with your provider on (date) at (time).  Just as we do with many in-office visits, in order for you to participate in this visit, we must obtain consent.  If you'd like, I can send this to your mychart (if signed up) or email for you to review.  Otherwise, I can obtain your verbal consent now.  All virtual visits are billed to your insurance company just like a normal visit would be.  By agreeing to a virtual visit, we'd like you to understand that the technology does not allow for your provider to perform an examination, and thus may limit your provider's ability to fully assess your condition. If your provider identifies any concerns that need to be evaluated in person, we will make arrangements to do so.  Finally, though the technology is pretty good, we cannot assure that it will always work on either your or our end, and in the setting of a video visit, we may have to convert it to a phone-only visit.  In either situation, we cannot ensure that we have a secure connection.  Are you willing to proceed?" STAFF: Did the patient verbally acknowledge consent to telehealth visit? Document YES/NO here:  YES  3. Advise patient to be prepared - "Two hours prior to your appointment, go ahead and check your blood pressure, pulse, oxygen saturation, and your weight (if you have the equipment to check those) and write them all down. When your visit starts, your provider will ask you for this information. If you have an Apple Watch or Kardia device, please plan to have heart rate information ready on the day of your appointment. Please have a pen and paper handy nearby the day of the visit as well."  4. Give patient instructions for MyChart download to smartphone OR Doximity/Doxy.me as below if video visit (depending on what platform provider is using)  5. Inform patient they will receive a phone call 15 minutes prior to their appointment time (may be from unknown caller ID) so they should be prepared to answer    TELEPHONE CALL NOTE  Gwendolyn [name] has been deemed a candidate for a follow-up tele-health visit to limit community exposure during the Covid-19 pandemic. I spoke with the patient via phone to ensure availability of phone/video source, confirm preferred email & phone number, and discuss instructions and expectations.  I reminded Gwendolyn [name] to be prepared with any vital sign and/or heart rhythm information that could potentially be obtained via home monitoring, at the time of her visit. I reminded Gwendolyn [name] to expect a phone call prior to her visit.  Vicky T  Slaughter 03/06/2019 1:20 PM   INSTRUCTIONS FOR DOWNLOADING THE MYCHART APP TO SMARTPHONE  - The patient must first make sure to have activated MyChart and know their login information - If Apple, go to [employer] and type in MyChart in the search bar and download the app. If Android, ask patient to go to [employer] and type in [place] in the search bar and download the app. The app is free but as with any other app downloads, their phone may require them to verify saved payment information or Apple/Android password.  -  The patient will need to then log into the app with their MyChart username and password, and select [facility] as their healthcare provider to link the account. When it is time for your visit, go to the MyChart app, find appointments, and click Begin Video Visit. Be sure to Select Allow for your device to access the Microphone and Camera for your visit. You will then be connected, and your provider will be with you shortly.  **If they have any issues connecting, or need assistance please contact MyChart service desk [phone])**  **If using a computer, in order to ensure the best quality for their visit they will need to use either of the following Internet Browsers: [employer], or Google Chrome**  IF USING DOXIMITY or DOXY.ME - The patient will receive a link just prior to their visit by text.     FULL LENGTH CONSENT FOR TELE-HEALTH VISIT   I hereby voluntarily request, consent and authorize [facility] and its employed or contracted physicians, physician assistants, nurse practitioners or other licensed health care professionals (the Practitioner), to provide me with telemedicine health care services (the "Services") as deemed necessary by the treating Practitioner. I acknowledge and consent to receive the Services by the Practitioner via telemedicine. I understand that the telemedicine visit will involve communicating with the Practitioner through live audiovisual communication technology and the disclosure of certain medical information by electronic transmission. I acknowledge that I have been given the opportunity to request an in-person assessment or other available alternative prior to the telemedicine visit and am voluntarily participating in the telemedicine visit.  I understand that I have the right to withhold or withdraw my consent to the use of telemedicine in the course of my care at any time, without affecting my right to future care or treatment, and that the  Practitioner or I may terminate the telemedicine visit at any time. I understand that I have the right to inspect all information obtained and/or recorded in the course of the telemedicine visit and may receive copies of available information for a reasonable fee.  I understand that some of the potential risks of receiving the Services via telemedicine include:  Marland Kitchen Delay or interruption in medical evaluation due to technological equipment failure or disruption; . Information transmitted may not be sufficient (e.g. poor resolution of images) to allow for appropriate medical decision making by the Practitioner; and/or  . In rare instances, security protocols could fail, causing a breach of personal health information.  Furthermore, I acknowledge that it is my responsibility to provide information about my medical history, conditions and care that is complete and accurate to the best of my ability. I acknowledge that Practitioner's advice, recommendations, and/or decision may be based on factors not within their control, such as incomplete or inaccurate data provided by me or distortions of diagnostic images or specimens that may result from electronic transmissions. I understand that the practice of  medicine is not an Chief Strategy Officer and that Practitioner makes no warranties or guarantees regarding treatment outcomes. I acknowledge that I will receive a copy of this consent concurrently upon execution via email to the email address I last provided but may also request a printed copy by calling the office of [facility].    I understand that my insurance will be billed for this visit.   I have read or had this consent read to me. . I understand the contents of this consent, which adequately explains the benefits and risks of the Services being provided via telemedicine.  . I have been provided ample opportunity to ask questions regarding this consent and the Services and have had my questions answered to my  satisfaction. . I give my informed consent for the services to be provided through the use of telemedicine in my medical care  By participating in this telemedicine visit I agree to the above.

## 2019-03-06 NOTE — Progress Notes (Signed)
Virtual Visit via Telephone Note   This visit type was conducted due to national recommendations for restrictions regarding the COVID-19 Pandemic (e.g. social distancing) in an effort to limit this patient's exposure and mitigate transmission in our community.  Due to her co-morbid illnesses, this patient is at least at moderate risk for complications without adequate follow up.  This format is felt to be most appropriate for this patient at this time.  The patient did not have access to video technology/had technical difficulties with video requiring transitioning to audio format only (telephone).  All issues noted in this document were discussed and addressed.  No physical exam could be performed with this format.  Please refer to the patient's chart for her  consent to telehealth for [facility].   Date:  03/06/2019   ID:  Gwendolyn [name], DOB 01-23-1981, MRN [id number]  Patient Location: Home Provider Location: Home  PCP:  Richardean Chimera, MD  Cardiologist:  Prentice Docker, MD  Electrophysiologist:  None   Evaluation Performed:  Follow-Up Visit  Chief Complaint: Tachycardia and palpitations  History of Present Illness:    Gwendolyn [name] is a 39 y.o. female with tachycardia and palpitations.  She was evaluated by EP in January 2020.  Event monitoring demonstrated sinus rhythm and no arrhythmias in January 2020.  Past medical history includes paroxysmal SVT and sinus tachycardia.  She also has a history of anxiety and panic attacks.  I personally reviewed some rhythm strips uploaded in media which demonstrated sinus tachycardia, 108 bpm.  She had an episode on 03/02/19. She called EMS. She tried vagal maneuvers which helped slow it down.  She said her HR was up to 280 bpm initially.  She has 6 children she looks after as well as a grandchild.   Past Medical History:  Diagnosis Date  . Bipolar disorder (HCC)   . Depression   . GERD (gastroesophageal reflux disease)   .  Migraines   . Palpitations   . SVT (supraventricular tachycardia) (HCC)    Past Surgical History:  Procedure Laterality Date  . ABDOMINAL HYSTERECTOMY    . CHOLECYSTECTOMY    . TUBAL LIGATION       Current Meds  Medication Sig  . albuterol (VENTOLIN HFA) 108 (90 Base) MCG/ACT inhaler Inhale into the lungs every 6 (six) hours as needed for wheezing or shortness of breath.  . diazepam (VALIUM) 10 MG tablet Take 10 mg by mouth 3 (three) times daily.  Marland Kitchen diltiazem (CARDIZEM CD) 240 MG 24 hr capsule Take 240 mg by mouth daily.  Marland Kitchen Galcanezumab-gnlm (EMGALITY) 120 MG/ML SOSY Inject into the skin.  Marland Kitchen ibuprofen (ADVIL) 800 MG tablet Take by mouth.  . megestrol (MEGACE) 40 MG tablet 1 tablet daily  . mirtazapine (REMERON) 45 MG tablet Take 45 mg by mouth at bedtime.  Marland Kitchen omeprazole (PRILOSEC) 20 MG capsule Take 1 capsule (20 mg total) by mouth daily. For acid reflux  . [DISCONTINUED] HYDROcodone-acetaminophen (NORCO/VICODIN) 5-325 MG tablet Take 1 tablet by mouth every 6 (six) hours as needed.  . [DISCONTINUED] piroxicam (FELDENE) 20 MG capsule Take 1 capsule (20 mg total) by mouth daily.     Allergies:   Amoxicillin   Social History   Tobacco Use  . Smoking status: Current Every Day Smoker    Packs/day: 1.00    Years: 10.00    Pack years: 10.00    Types: Cigarettes  . Smokeless tobacco: Never Used  Substance Use Topics  . Alcohol use:  Yes    Alcohol/week: 0.0 standard drinks    Comment: occasionally  . Drug use: Yes    Types: Marijuana     Family Hx: [name] family history includes Heart disease in her father and another family member.  ROS:   Please see the history of present illness.     All other systems reviewed and are negative.   Prior CV studies:   The following studies were reviewed today:  Echocardiogram in 2014 demonstrated normal left ventricular systolic function, LVEF 60-65%.  Labs/Other Tests and Data Reviewed:    EKG:  Reviewed above  Recent  Labs: No results found for requested labs within last 8760 hours.   Recent Lipid Panel No results found for: CHOL, TRIG, HDL, CHOLHDL, LDLCALC, LDLDIRECT  Wt Readings from Last 3 Encounters:  03/06/19 124 lb (56.2 kg)  01/13/19 120 lb (54.4 kg)  04/02/18 127 lb (57.6 kg)     Objective:    Vital Signs:  Pulse 89   Ht 5' (1.524 m)   Wt 124 lb (56.2 kg)   LMP  (LMP Unknown)   SpO2 98%   BMI 24.22 kg/m    VITAL SIGNS:  reviewed  ASSESSMENT & PLAN:    1. Tachycardia/palpitations: Reported history of PSVT. HR was reportedly 280 bpm as per patient on 03/02/19. Currently on Cardizem CD 240 mg daily. She may need an EP study or perhaps an implantable loop recorder, the latter to provide conclusive evidence of SVT.  She has an appointment with EP/Dr. Johney Frame on 03/09/19. Event monitoring in January 2020 did not reveal any arrhythmias. She stays fatigued but wants to stay on diltiazem. She does not have a BP cuff at home.   COVID-19 Education: The signs and symptoms of COVID-19 were discussed with the patient and how to seek care for testing (follow up with PCP or arrange E-visit).  The importance of social distancing was discussed today.  Time:   Today, I have spent 15 minutes with the patient with telehealth technology discussing the above problems.     Medication Adjustments/Labs and Tests Ordered: Current medicines are reviewed at length with the patient today.  Concerns regarding medicines are outlined above.   Tests Ordered: No orders of the defined types were placed in this encounter.   Medication Changes: No orders of the defined types were placed in this encounter.   Follow Up:  Virtual Visit  prn with me. FU with EP as scheduled.  Signed, Prentice Docker, MD  03/06/2019 2:35 PM    [facility]

## 2019-03-06 NOTE — Patient Instructions (Addendum)
Your physician recommends that you schedule a follow-up appointment in: AS NEEDED WITH DR Purvis Sheffield AND KEEP APPOINTMENT WITH DR Johney Frame 03/09/2019  Your physician recommends that you continue on your current medications as directed. Please refer to the Current Medication list given to you today.  Thank you for choosing [facility]!!

## 2019-03-09 ENCOUNTER — Telehealth (INDEPENDENT_AMBULATORY_CARE_PROVIDER_SITE_OTHER): Payer: Medicaid Other | Admitting: Internal Medicine

## 2019-03-09 ENCOUNTER — Encounter: Payer: Self-pay | Admitting: Internal Medicine

## 2019-03-09 VITALS — HR 89 | Ht 60.0 in | Wt 125.0 lb

## 2019-03-09 DIAGNOSIS — R002 Palpitations: Secondary | ICD-10-CM

## 2019-03-09 NOTE — Progress Notes (Signed)
Electrophysiology TeleHealth Note   Due to national recommendations of social distancing due to COVID 19, an audio/video telehealth visit is felt to be most appropriate for this patient at this time.  See MyChart message from today for the patient's consent to telehealth for [facility].  Date:  03/09/2019   ID:  Gwendolyn [name], DOB [birth year], MRN [id number]  Location: patient's home  Provider location:  [facility]  Evaluation Performed: Follow-up visit  PCP:  Richardean Chimera, MD   Electrophysiologist:  Dr Johney Frame  Chief Complaint:  palpitations  History of Present Illness:    Gwendolyn [name] is a 39 y.o. female who presents via telehealth conferencing today.  Since last being seen in our clinic, the patient reports doing very well.  She continues to have occasional palpitations.  She has sent EMS strips to me which document only sinus rhythm.  We have not been able to document SVT previously.  She remains very concerned about her palpitations.  Today, she denies symptoms of chest pain, shortness of breath,  lower extremity edema, dizziness, presyncope, or syncope.  The patient is otherwise without complaint today.   Past Medical History:  Diagnosis Date  . Bipolar disorder (HCC)   . Depression   . GERD (gastroesophageal reflux disease)   . Migraines   . Palpitations   . SVT (supraventricular tachycardia) (HCC)     Past Surgical History:  Procedure Laterality Date  . ABDOMINAL HYSTERECTOMY    . CHOLECYSTECTOMY    . TUBAL LIGATION      Current Outpatient Medications  Medication Sig Dispense Refill  . albuterol (VENTOLIN HFA) 108 (90 Base) MCG/ACT inhaler Inhale into the lungs every 6 (six) hours as needed for wheezing or shortness of breath.    . diazepam (VALIUM) 10 MG tablet Take 10 mg by mouth 3 (three) times daily.    Marland Kitchen diltiazem (CARDIZEM CD) 240 MG 24 hr capsule Take 240 mg by mouth daily.    . Galcanezumab-gnlm (EMGALITY) 120 MG/ML SOSY Inject 1 Dose into  the skin every 30 (thirty) days.     Marland Kitchen ibuprofen (ADVIL) 800 MG tablet Take 800 mg by mouth as needed for mild pain.     . megestrol (MEGACE) 40 MG tablet 1 tablet daily 30 tablet 3  . mirtazapine (REMERON) 45 MG tablet Take 45 mg by mouth at bedtime.    Marland Kitchen omeprazole (PRILOSEC) 20 MG capsule Take 1 capsule (20 mg total) by mouth daily. For acid reflux     No current facility-administered medications for this visit.    Allergies:   Amoxicillin   Social History:  The patient  reports that she has been smoking cigarettes. She has a 10.00 pack-year smoking history. She has never used smokeless tobacco. She reports current alcohol use. She reports current drug use. Drug: Marijuana.   Family History:  [name] family history includes Heart disease in her father and another family member.   ROS:  Please see the history of present illness.   All other systems are personally reviewed and negative.    Exam:    Vital Signs:  Pulse 89   Ht 5' (1.524 m)   Wt 125 lb (56.7 kg)   LMP  (LMP Unknown)   SpO2 98%   BMI 24.41 kg/m   Well sounding and appearing, alert and conversant, regular work of breathing,  good skin color Eyes- anicteric, neuro- grossly intact, skin- no apparent rash or lesions or cyanosis, mouth- oral  mucosa is pink  Labs/Other Tests and Data Reviewed:    Recent Labs: No results found for requested labs within last 8760 hours.   Wt Readings from Last 3 Encounters:  03/09/19 125 lb (56.7 kg)  03/06/19 124 lb (56.2 kg)  01/13/19 120 lb (54.4 kg)     ASSESSMENT & PLAN:    1.  Tachycardia Only sinus rhythm documented previously No high risk features on ekg, event monitor or echo I have discussed options to further monitor her heart at length.  Given the infrequent nature of her symptoms, I do not feel that another 30 day monitor would be beneficial.  I have offered [employer] and also long term monitoring with an implanted monitor to her today. She would like to  think about these options and then follow-up with my team later this week.  I would defer EP study until we have successfully documented an arrhythmia.   Patient Risk:  after full review of this patients clinical status, I feel that they are at moderate risk at this time.  Today, I have spent 15 minutes with the patient with telehealth technology discussing arrhythmia management .    Randolm Idol, MD  03/09/2019 4:08 PM     [facility] [address] [place] [address] [phone] (office) [phone] (fax)

## 2019-03-16 ENCOUNTER — Encounter: Payer: Self-pay | Admitting: Obstetrics & Gynecology

## 2019-03-16 ENCOUNTER — Other Ambulatory Visit: Payer: Self-pay

## 2019-03-16 ENCOUNTER — Telehealth: Payer: Self-pay | Admitting: Internal Medicine

## 2019-03-16 ENCOUNTER — Telehealth (INDEPENDENT_AMBULATORY_CARE_PROVIDER_SITE_OTHER): Payer: Medicaid Other | Admitting: Obstetrics & Gynecology

## 2019-03-16 VITALS — HR 89 | Ht 60.0 in | Wt 124.5 lb

## 2019-03-16 DIAGNOSIS — N83202 Unspecified ovarian cyst, left side: Secondary | ICD-10-CM | POA: Diagnosis not present

## 2019-03-16 NOTE — Progress Notes (Signed)
   TELEHEALTH MyChart connect video VIRTUAL GYNECOLOGY VISIT ENCOUNTER NOTE  I connected with Bonnye Fava on 03/16/19 at  1:30 PM EST by telephone at home and verified that I am speaking with the correct person using two identifiers.   I discussed the limitations, risks, security and privacy concerns of performing an evaluation and management service by telephone and the availability of in person appointments. I also discussed with the patient that there may be a patient responsible charge related to this service. The patient expressed understanding and agreed to proceed.   History:  Gwendolyn [name] is a 39 y.o. [phone] female being evaluated today for management of an ovarian cyst with pain. She denies any abnormal vaginal discharge, bleeding, pelvic pain or other concerns.       Past Medical History:  Diagnosis Date  . Bipolar disorder (HCC)   . Depression   . GERD (gastroesophageal reflux disease)   . Migraines   . Palpitations   . SVT (supraventricular tachycardia) (HCC)    Past Surgical History:  Procedure Laterality Date  . ABDOMINAL HYSTERECTOMY    . CHOLECYSTECTOMY    . TUBAL LIGATION     The following portions of the patient's history were reviewed and updated as appropriate: allergies, current medications, past family history, past medical history, past social history, past surgical history and problem list.   Health Maintenance:  Normal pap and negative HRHPV on .  Normal mammogram at [facility] .   Review of Systems:  Pertinent items noted in HPI and remainder of comprehensive ROS otherwise negative.  Physical Exam:  Physical exam deferred due to nature of the encounter  Labs and Imaging No results found for this or any previous visit (from the past 336 hour(s)). No results found.     No orders of the defined types were placed in this encounter.   No orders of the defined types were placed in this encounter.   Assessment and Plan:       ICD-10-CM   1. Cyst of  left ovary  N83.202    Pain has resolved on megestrol.  Stop the megestrol and follow clinically, if pain and/or cyst retrurn in future can do another 6-9 weeks course for suppression         I discussed the assessment and treatment plan with the patient. The patient was provided an opportunity to ask questions and all were answered. The patient agreed with the plan and demonstrated an understanding of the instructions.   The patient was advised to call back or seek an in-person evaluation/go to the ED if the symptoms worsen or if the condition fails to improve as anticipated.  I provided 15 minutes of non-face-to-face time during this encounter.   Lazaro Arms, MD [facility] for [facility]

## 2019-03-16 NOTE — Telephone Encounter (Signed)
Patient calling in regards to her mychart message about her EKG readings. She would like a call back.

## 2019-03-16 NOTE — Telephone Encounter (Signed)
Called patient back. Informed her that Dr. Johney Frame and his nurse are not working in the office today. Informed her a message would be sent to them both to check her mychart about her EKG reading she sent.

## 2019-03-18 NOTE — Telephone Encounter (Signed)
Follow up    Patient is following up on her EKG readings.

## 2019-03-27 NOTE — Telephone Encounter (Signed)
I do see an episode of SVT on her EMS strips.   I am ok with continued monitoring at this time.

## 2019-04-13 ENCOUNTER — Telehealth: Payer: Self-pay | Admitting: Obstetrics & Gynecology

## 2019-04-13 MED ORDER — KETOROLAC TROMETHAMINE 10 MG PO TABS: 10.0000 mg | ORAL_TABLET | Freq: Three times a day (TID) | ORAL | 0 refills | Status: DC | PRN

## 2019-04-14 ENCOUNTER — Telehealth: Payer: Self-pay | Admitting: *Deleted

## 2019-04-14 NOTE — Telephone Encounter (Signed)
Patient left message that had sent Dr. Despina Hidden a my chart message with no response and would like call back.

## 2019-04-15 ENCOUNTER — Other Ambulatory Visit: Payer: Self-pay | Admitting: *Deleted

## 2019-04-16 ENCOUNTER — Telehealth: Payer: Self-pay | Admitting: Obstetrics & Gynecology

## 2019-04-16 MED ORDER — CLINDAMYCIN HCL 300 MG PO CAPS: 300.0000 mg | ORAL_CAPSULE | Freq: Three times a day (TID) | ORAL | 0 refills | Status: DC

## 2019-04-17 ENCOUNTER — Encounter: Payer: Self-pay | Admitting: *Deleted

## 2019-04-27 ENCOUNTER — Encounter (HOSPITAL_COMMUNITY)
Admission: RE | Admit: 2019-04-27 | Discharge: 2019-04-27 | Disposition: A | Discharge status: Home / Self Care | Payer: Medicaid Other | Source: Ambulatory Visit | Attending: Obstetrics & Gynecology | Admitting: Obstetrics & Gynecology

## 2019-04-27 ENCOUNTER — Other Ambulatory Visit: Payer: Self-pay | Admitting: Obstetrics & Gynecology

## 2019-04-27 ENCOUNTER — Other Ambulatory Visit: Payer: Self-pay

## 2019-04-27 ENCOUNTER — Other Ambulatory Visit (HOSPITAL_COMMUNITY)
Admission: RE | Admit: 2019-04-27 | Discharge: 2019-04-27 | Disposition: A | Discharge status: Home / Self Care | Payer: Medicaid Other | Source: Ambulatory Visit | Attending: Obstetrics & Gynecology | Admitting: Obstetrics & Gynecology

## 2019-04-27 DIAGNOSIS — Z20822 Contact with and (suspected) exposure to covid-19: Secondary | ICD-10-CM | POA: Insufficient documentation

## 2019-04-27 DIAGNOSIS — Z01818 Encounter for other preprocedural examination: Secondary | ICD-10-CM | POA: Insufficient documentation

## 2019-04-27 DIAGNOSIS — R002 Palpitations: Secondary | ICD-10-CM | POA: Diagnosis not present

## 2019-04-27 LAB — CBC
HCT: 45.3 % (ref 36.0–46.0)
Hemoglobin: 14.6 g/dL (ref 12.0–15.0)
MCH: 27.4 pg (ref 26.0–34.0)
MCHC: 32.2 g/dL (ref 30.0–36.0)
MCV: 85 fL (ref 80.0–100.0)
Platelets: 202 10*3/uL (ref 150–400)
RBC: 5.33 MIL/uL — ABNORMAL HIGH (ref 3.87–5.11)
RDW: 14 % (ref 11.5–15.5)
WBC: 4.6 10*3/uL (ref 4.0–10.5)
nRBC: 0 % (ref 0.0–0.2)

## 2019-04-27 LAB — COMPREHENSIVE METABOLIC PANEL
ALT: 21 U/L (ref 0–44)
AST: 23 U/L (ref 15–41)
Albumin: 4.5 g/dL (ref 3.5–5.0)
Alkaline Phosphatase: 46 U/L (ref 38–126)
Anion gap: 8 (ref 5–15)
BUN: 15 mg/dL (ref 6–20)
CO2: 23 mmol/L (ref 22–32)
Calcium: 9.2 mg/dL (ref 8.9–10.3)
Chloride: 107 mmol/L (ref 98–111)
Creatinine, Ser: 0.86 mg/dL (ref 0.44–1.00)
GFR calc Af Amer: >60 mL/min (ref >60)
GFR calc non Af Amer: >60 mL/min (ref >60)
Glucose, Bld: 99 mg/dL (ref 70–99)
Potassium: 4.1 mmol/L (ref 3.5–5.1)
Sodium: 138 mmol/L (ref 135–145)
Total Bilirubin: 0.4 mg/dL (ref 0.3–1.2)
Total Protein: 7.4 g/dL (ref 6.5–8.1)

## 2019-04-27 LAB — ABO/RH: ABO/RH(D): A POS

## 2019-04-27 LAB — URINALYSIS, ROUTINE W REFLEX MICROSCOPIC
Bilirubin Urine: NEGATIVE
Glucose, UA: NEGATIVE mg/dL
Hgb urine dipstick: NEGATIVE
Ketones, ur: NEGATIVE mg/dL
Leukocytes,Ua: NEGATIVE
Nitrite: NEGATIVE
Protein, ur: NEGATIVE mg/dL
Specific Gravity, Urine: 1.018 (ref 1.005–1.030)
pH: 5 (ref 5.0–8.0)

## 2019-04-27 LAB — SARS CORONAVIRUS 2 (TAT 6-24 HRS): SARS Coronavirus 2: NEGATIVE

## 2019-04-27 LAB — RAPID HIV SCREEN (HIV 1/2 AB+AG)
HIV 1/2 Antibodies: NONREACTIVE
HIV-1 P24 Antigen - HIV24: NONREACTIVE

## 2019-04-27 LAB — TYPE AND SCREEN
ABO/RH(D): A POS
Antibody Screen: NEGATIVE

## 2019-04-27 NOTE — Patient Instructions (Signed)
Your procedure is scheduled on: 04/29/2019  Report to [facility] at  12   PM.  Call this number if you have problems the morning of surgery: [phone]   Remember:   Do not Eat or Drink after midnight         No Smoking the morning of surgery  :  Take these medicines the morning of surgery with A SIP OF WATER: Diltiazem, omeprazole, and valium   Use inhalers if needed   Do not wear jewelry, make-up or nail polish.  Do not wear lotions, powders, or perfumes. You may wear deodorant.  Do not shave 48 hours prior to surgery. Men may shave face and neck.  Do not bring valuables to the hospital.  Contacts, dentures or bridgework may not be worn into surgery.  Leave suitcase in the car. After surgery it may be brought to your room.  For patients admitted to the hospital, checkout time is 11:00 AM the day of discharge.   Patients discharged the day of surgery will not be allowed to drive home.    Special Instructions: Shower using CHG night before surgery and shower the day of surgery use CHG.  Use special wash - you have one bottle of CHG for all showers.  You should use approximately 1/2 of the bottle for each shower.  Bilateral Salpingo-Oophorectomy  Bilateral salpingo-oophorectomy is the surgical removal of both fallopian tubes and both ovaries. The ovaries are reproductive organs that produce eggs in women. The fallopian tubes allow eggs to move from the ovaries to the uterus. You may need this procedure if you:  Have had your uterus removed. This procedure is usually done after the uterus is removed.  Have cancer of the fallopian tubes or ovaries.  Have a high risk of cancer of the fallopian tubes or ovaries. There are three different techniques that can be used for this procedure:  Open. One large incision will be made in your abdomen.  Laparoscopic. A thin, lighted tube with a small camera on the end (laparoscope) will be used to help perform the procedure. The laparoscope  will allow your surgeon to make several small incisions in the abdomen instead of one large incision.  Robot-assisted. A computer will be used to control surgical instruments that are attached to robotic arms. A laparoscope may also be used with this technique. As a result of this procedure, you will become sterile (unable to become pregnant), and you will go into menopause (no longer able to have menstrual periods). You may develop symptoms of menopause such as hot flashes, night sweats, and mood changes. Your sex drive may also be affected. Tell a health care provider about:  Any allergies you have.  All medicines you are taking, including vitamins, herbs, eye drops, creams, and over-the-counter medicines.  Any problems you or family members have had with anesthetic medicines.  Any blood disorders you have.  Any surgeries you have had.  Any medical conditions you have.  Whether you are pregnant or may be pregnant. What are the risks? Generally, this is a safe procedure. However, problems may occur, including:  Infection.  Bleeding.  Allergic reactions to medicines.  Damage to other structures or organs.  Blood clots in the legs or lungs. What happens before the procedure? Staying hydrated Follow instructions from your health care provider about hydration, which may include:  Up to 2 hours before the procedure - you may continue to drink clear liquids, such as water, clear fruit juice, black  coffee, and plain tea.  Eating and drinking restrictions Follow instructions from your health care provider about eating and drinking, which may include:  8 hours before the procedure - stop eating heavy meals or foods such as meat, fried foods, or fatty foods.  6 hours before the procedure - stop eating light meals or foods, such as toast or cereal.  6 hours before the procedure - stop drinking milk or drinks that contain milk.  2 hours before the procedure - stop drinking clear  liquids. Medicines  Ask your health care provider about: ? Changing or stopping your regular medicines. This is especially important if you are taking diabetes medicines or blood thinners. ? Taking medicines such as aspirin and ibuprofen. These medicines can thin your blood. Do not take these medicines before your procedure if your health care provider instructs you not to.  You may be given antibiotic medicine to help prevent infection. General instructions  Do not smoke for at least 2 weeks before your procedure or as told by your health care provider.  You may have an exam or testing.  You may have a blood or urine sample taken.  Ask your health care provider how your surgical site will be marked or identified.  Plan to have someone take you home from the hospital.  If you will be going home right after the procedure, plan to have someone with you for 24 hours. What happens during the procedure?  To reduce your risk of infection: ? Your health care team will wash or sanitize their hands. ? Your skin will be washed with soap. ? Hair may be removed from the surgical area.  An IV tube will be inserted into one of your veins.  You will be given one or more of the following: ? A medicine to help you relax (sedative). ? A medicine to make you fall asleep (general anesthetic).  A thin tube (catheter) will be inserted through your urethra and into your bladder. The catheter drains urine during your procedure.  Depending on the type of surgery you are having, your surgeon will do one of the following: ? Make one incision in your abdomen (open surgery). ? Make two small incisions in your abdomen (laparoscopic surgery). The laparoscope will be passed through one incision, and surgical instruments will be passed through the other. ? Make several small incisions in your abdomen (robot-assisted surgery). A laparoscope and other surgical instruments may be passed through the  incisions.  Your fallopian tubes and ovaries will be cut away from the uterus and removed.  Your blood vessels will be clamped and tied to prevent too much bleeding.  The incision(s) in your abdomen will be closed with stitches (sutures) or staples.  A bandage (dressing) may be placed over your incision(s). The procedure may vary among health care providers and hospitals. What happens after the procedure?  Your blood pressure, heart rate, breathing rate, and blood oxygen level will be monitored until the medicines you were given have worn off.  You may continue to receive fluids and medicines through an IV tube.  You may continue to have a catheter draining your urine.  You may have to wear compression stockings. These stockings help to prevent blood clots and reduce swelling in your legs.  You will be given pain medicine as needed.  Do not drive for 24 hours if you received a sedative. Summary  Bilateral salpingo-oophorectomy is a procedure to remove both fallopian tubes and both ovaries.  There  are three different techniques that can be used for this procedure, including open, laparoscopic, and robotic. Talk with your health care provider about how your procedure will be done.  As a result of this procedure, you will become sterile and you will go into menopause.  Plan to have someone take you home from the hospital. This information is not intended to replace advice given to you by your health care provider. Make sure you discuss any questions you have with your health care provider. Document Revised: 04/18/2018 Document Reviewed: 03/19/2016 Elsevier Patient Education  Gwendolyn [name].  Bilateral Salpingo-Oophorectomy, Care After This sheet gives you information about how to care for yourself after your procedure. Your health care provider may also give you more specific instructions. If you have problems or questions, contact your health care provider. What can I expect  after the procedure? After the procedure, it is common to have:  Abdominal pain.  Some occasional vaginal bleeding (spotting).  Tiredness.  Symptoms of menopause, such as hot flashes, night sweats, or mood swings. Follow these instructions at home: Incision care   Keep your incision area and your bandage (dressing) clean and dry.  Follow instructions from your health care provider about how to take care of your incision. Make sure you: ? Wash your hands with soap and water before you change your dressing. If soap and water are not available, use hand sanitizer. ? Change your dressing as told by your health care provider. ? Leave stitches (sutures), staples, skin glue, or adhesive strips in place. These skin closures may need to stay in place for 2 weeks or longer. If adhesive strip edges start to loosen and curl up, you may trim the loose edges. Do not remove adhesive strips completely unless your health care provider tells you to do that.  Check your incision area every day for signs of infection. Check for: ? Redness, swelling, or pain. ? Fluid or blood. ? Warmth. ? Pus or a bad smell. Activity   Do not drive or use heavy machinery while taking prescription pain medicine.  Do not drive for 24 hours if you received a medicine to help you relax (sedative) during your procedure.  Take frequent, short walks throughout the day. Rest when you get tired. Ask your health care provider what activities are safe for you.  Avoid activity that requires great effort. Also, avoid heavy lifting. Do not lift anything that is heavier than 10 lbs. (4.5 kg), or the limit that your health care provider tells you, until he or she says that it is safe to do so.  Do not douche, use tampons, or have sex until your health care provider approves. General instructions   To prevent or treat constipation while you are taking prescription pain medicine, your health care provider may recommend that  you: ? Drink enough fluid to keep your urine clear or pale yellow. ? Take over-the-counter or prescription medicines. ? Eat foods that are high in fiber, such as fresh fruits and vegetables, whole grains, and beans. ? Limit foods that are high in fat and processed sugars, such as fried and sweet foods.  Take over-the-counter and prescription medicines only as told by your health care provider.  Do not take baths, swim, or use a hot tub until your health care provider approves. Ask your health care provider if you can take showers. You may only be allowed to take sponge baths for bathing.  Wear compression stockings as told by your health care  provider. These stockings help to prevent blood clots and reduce swelling in your legs.  Keep all follow-up visits as told by your health care provider. This is important. Contact a health care provider if:  You have pain when you urinate.  You have pus or a bad smelling discharge coming from your vagina.  You have redness, swelling, or pain around your incision.  You have fluid or blood coming from your incision.  Your incision feels warm to the touch.  You have pus or a bad smell coming from your incision.  You have a fever.  Your incision starts to break open.  You have pain in the abdomen, and it gets worse or does not get better when you take medicine.  You develop a rash.  You develop nausea and vomiting.  You feel lightheaded. Get help right away if:  You develop pain in your chest or leg.  You become short of breath.  You faint.  You have increased bleeding from your vagina. Summary  After the procedure, it is common to have pain, bleeding in the vagina, and symptoms of menopause.  Follow instructions from your health care provider about how to take care of your incision.  Follow instructions from your health care provider about activities and restrictions.  Check your incision every day for signs of infection and  report any symptoms to your health care provider. This information is not intended to replace advice given to you by your health care provider. Make sure you discuss any questions you have with your health care provider. Document Revised: 04/18/2018 Document Reviewed: 03/19/2016 Elsevier Patient Education  2020 [employer]  General Anesthesia, Adult, Care After This sheet gives you information about how to care for yourself after your procedure. Your health care provider may also give you more specific instructions. If you have problems or questions, contact your health care provider. What can I expect after the procedure? After the procedure, the following side effects are common:  Pain or discomfort at the IV site.  Nausea.  Vomiting.  Sore throat.  Trouble concentrating.  Feeling cold or chills.  Weak or tired.  Sleepiness and fatigue.  Soreness and body aches. These side effects can affect parts of the body that were not involved in surgery. Follow these instructions at home:  For at least 24 hours after the procedure:  Have a responsible adult stay with you. It is important to have someone help care for you until you are awake and alert.  Rest as needed.  Do not: ? Participate in activities in which you could fall or become injured. ? Drive. ? Use heavy machinery. ? Drink alcohol. ? Take sleeping pills or medicines that cause drowsiness. ? Make important decisions or sign legal documents. ? Take care of children on your own. Eating and drinking  Follow any instructions from your health care provider about eating or drinking restrictions.  When you feel hungry, start by eating small amounts of foods that are soft and easy to digest (bland), such as toast. Gradually return to your regular diet.  Drink enough fluid to keep your urine pale yellow.  If you vomit, rehydrate by drinking water, juice, or clear broth. General instructions  If you have sleep apnea,  surgery and certain medicines can increase your risk for breathing problems. Follow instructions from your health care provider about wearing your sleep device: ? Anytime you are sleeping, including during daytime naps. ? While taking prescription pain medicines, sleeping medicines, or  medicines that make you drowsy.  Return to your normal activities as told by your health care provider. Ask your health care provider what activities are safe for you.  Take over-the-counter and prescription medicines only as told by your health care provider.  If you smoke, do not smoke without supervision.  Keep all follow-up visits as told by your health care provider. This is important. Contact a health care provider if:  You have nausea or vomiting that does not get better with medicine.  You cannot eat or drink without vomiting.  You have pain that does not get better with medicine.  You are unable to pass urine.  You develop a skin rash.  You have a fever.  You have redness around your IV site that gets worse. Get help right away if:  You have difficulty breathing.  You have chest pain.  You have blood in your urine or stool, or you vomit blood. Summary  After the procedure, it is common to have a sore throat or nausea. It is also common to feel tired.  Have a responsible adult stay with you for the first 24 hours after general anesthesia. It is important to have someone help care for you until you are awake and alert.  When you feel hungry, start by eating small amounts of foods that are soft and easy to digest (bland), such as toast. Gradually return to your regular diet.  Drink enough fluid to keep your urine pale yellow.  Return to your normal activities as told by your health care provider. Ask your health care provider what activities are safe for you. This information is not intended to replace advice given to you by your health care provider. Make sure you discuss any  questions you have with your health care provider. Document Revised: 02/15/2017 Document Reviewed: 09/28/2016 Elsevier Patient Education  2020 [employer].

## 2019-04-28 ENCOUNTER — Encounter (HOSPITAL_COMMUNITY): Payer: Self-pay | Admitting: Anesthesiology

## 2019-04-29 ENCOUNTER — Other Ambulatory Visit: Payer: Self-pay | Admitting: Obstetrics & Gynecology

## 2019-04-29 MED ORDER — KETOROLAC TROMETHAMINE 10 MG PO TABS: 10.0000 mg | ORAL_TABLET | Freq: Three times a day (TID) | ORAL | 0 refills | Status: DC | PRN

## 2019-04-29 NOTE — OR Nursing (Signed)
Patient notified of change in surgical procedure time related to possible exposure to covid.  Per Dr. Despina Hidden based on patient symptoms and health status she will be reschedule within 2 - 4 weeks.  Office will contact patient to reschedule. Patient thankful and understands.

## 2019-05-11 ENCOUNTER — Encounter: Payer: Medicaid Other | Admitting: Obstetrics & Gynecology

## 2019-05-13 NOTE — Patient Instructions (Signed)
Your procedure is scheduled on: 05/20/2019  Report to [facility] at   7:00  AM.  Call this number if you have problems the morning of surgery: [phone]   Remember:   Do not Eat or Drink after midnight         No Smoking the morning of surgery  :  Take these medicines the morning of surgery with A SIP OF WATER: Diltiazem and omeprazole   Use Albuterol inhaler if needed   Do not wear jewelry, make-up or nail polish.  Do not wear lotions, powders, or perfumes. You may wear deodorant.  Do not shave 48 hours prior to surgery. Men may shave face and neck.  Do not bring valuables to the hospital.  Contacts, dentures or bridgework may not be worn into surgery.  Leave suitcase in the car. After surgery it may be brought to your room.  For patients admitted to the hospital, checkout time is 11:00 AM the day of discharge.   Patients discharged the day of surgery will not be allowed to drive home.    Special Instructions: Shower using CHG night before surgery and shower the day of surgery use CHG.  Use special wash - you have one bottle of CHG for all showers.  You should use approximately 1/2 of the bottle for each shower.  Bilateral Salpingo-Oophorectomy  Bilateral salpingo-oophorectomy is the surgical removal of both fallopian tubes and both ovaries. The ovaries are reproductive organs that produce eggs in women. The fallopian tubes allow eggs to move from the ovaries to the uterus. You may need this procedure if you:  Have had your uterus removed. This procedure is usually done after the uterus is removed.  Have cancer of the fallopian tubes or ovaries.  Have a high risk of cancer of the fallopian tubes or ovaries. There are three different techniques that can be used for this procedure:  Open. One large incision will be made in your abdomen.  Laparoscopic. A thin, lighted tube with a small camera on the end (laparoscope) will be used to help perform the procedure. The laparoscope  will allow your surgeon to make several small incisions in the abdomen instead of one large incision.  Robot-assisted. A computer will be used to control surgical instruments that are attached to robotic arms. A laparoscope may also be used with this technique. As a result of this procedure, you will become sterile (unable to become pregnant), and you will go into menopause (no longer able to have menstrual periods). You may develop symptoms of menopause such as hot flashes, night sweats, and mood changes. Your sex drive may also be affected. Tell a health care provider about:  Any allergies you have.  All medicines you are taking, including vitamins, herbs, eye drops, creams, and over-the-counter medicines.  Any problems you or family members have had with anesthetic medicines.  Any blood disorders you have.  Any surgeries you have had.  Any medical conditions you have.  Whether you are pregnant or may be pregnant. What are the risks? Generally, this is a safe procedure. However, problems may occur, including:  Infection.  Bleeding.  Allergic reactions to medicines.  Damage to other structures or organs.  Blood clots in the legs or lungs. What happens before the procedure? Staying hydrated Follow instructions from your health care provider about hydration, which may include:  Up to 2 hours before the procedure - you may continue to drink clear liquids, such as water, clear fruit juice, black  coffee, and plain tea.  Eating and drinking restrictions Follow instructions from your health care provider about eating and drinking, which may include:  8 hours before the procedure - stop eating heavy meals or foods such as meat, fried foods, or fatty foods.  6 hours before the procedure - stop eating light meals or foods, such as toast or cereal.  6 hours before the procedure - stop drinking milk or drinks that contain milk.  2 hours before the procedure - stop drinking clear  liquids. Medicines  Ask your health care provider about: ? Changing or stopping your regular medicines. This is especially important if you are taking diabetes medicines or blood thinners. ? Taking medicines such as aspirin and ibuprofen. These medicines can thin your blood. Do not take these medicines before your procedure if your health care provider instructs you not to.  You may be given antibiotic medicine to help prevent infection. General instructions  Do not smoke for at least 2 weeks before your procedure or as told by your health care provider.  You may have an exam or testing.  You may have a blood or urine sample taken.  Ask your health care provider how your surgical site will be marked or identified.  Plan to have someone take you home from the hospital.  If you will be going home right after the procedure, plan to have someone with you for 24 hours. What happens during the procedure?  To reduce your risk of infection: ? Your health care team will wash or sanitize their hands. ? Your skin will be washed with soap. ? Hair may be removed from the surgical area.  An IV tube will be inserted into one of your veins.  You will be given one or more of the following: ? A medicine to help you relax (sedative). ? A medicine to make you fall asleep (general anesthetic).  A thin tube (catheter) will be inserted through your urethra and into your bladder. The catheter drains urine during your procedure.  Depending on the type of surgery you are having, your surgeon will do one of the following: ? Make one incision in your abdomen (open surgery). ? Make two small incisions in your abdomen (laparoscopic surgery). The laparoscope will be passed through one incision, and surgical instruments will be passed through the other. ? Make several small incisions in your abdomen (robot-assisted surgery). A laparoscope and other surgical instruments may be passed through the  incisions.  Your fallopian tubes and ovaries will be cut away from the uterus and removed.  Your blood vessels will be clamped and tied to prevent too much bleeding.  The incision(s) in your abdomen will be closed with stitches (sutures) or staples.  A bandage (dressing) may be placed over your incision(s). The procedure may vary among health care providers and hospitals. What happens after the procedure?  Your blood pressure, heart rate, breathing rate, and blood oxygen level will be monitored until the medicines you were given have worn off.  You may continue to receive fluids and medicines through an IV tube.  You may continue to have a catheter draining your urine.  You may have to wear compression stockings. These stockings help to prevent blood clots and reduce swelling in your legs.  You will be given pain medicine as needed.  Do not drive for 24 hours if you received a sedative. Summary  Bilateral salpingo-oophorectomy is a procedure to remove both fallopian tubes and both ovaries.  There  are three different techniques that can be used for this procedure, including open, laparoscopic, and robotic. Talk with your health care provider about how your procedure will be done.  As a result of this procedure, you will become sterile and you will go into menopause.  Plan to have someone take you home from the hospital. This information is not intended to replace advice given to you by your health care provider. Make sure you discuss any questions you have with your health care provider. Document Revised: 04/18/2018 Document Reviewed: 03/19/2016 Elsevier Patient Education  Gwendolyn [name].  Bilateral Salpingo-Oophorectomy, Care After This sheet gives you information about how to care for yourself after your procedure. Your health care provider may also give you more specific instructions. If you have problems or questions, contact your health care provider. What can I expect  after the procedure? After the procedure, it is common to have:  Abdominal pain.  Some occasional vaginal bleeding (spotting).  Tiredness.  Symptoms of menopause, such as hot flashes, night sweats, or mood swings. Follow these instructions at home: Incision care   Keep your incision area and your bandage (dressing) clean and dry.  Follow instructions from your health care provider about how to take care of your incision. Make sure you: ? Wash your hands with soap and water before you change your dressing. If soap and water are not available, use hand sanitizer. ? Change your dressing as told by your health care provider. ? Leave stitches (sutures), staples, skin glue, or adhesive strips in place. These skin closures may need to stay in place for 2 weeks or longer. If adhesive strip edges start to loosen and curl up, you may trim the loose edges. Do not remove adhesive strips completely unless your health care provider tells you to do that.  Check your incision area every day for signs of infection. Check for: ? Redness, swelling, or pain. ? Fluid or blood. ? Warmth. ? Pus or a bad smell. Activity   Do not drive or use heavy machinery while taking prescription pain medicine.  Do not drive for 24 hours if you received a medicine to help you relax (sedative) during your procedure.  Take frequent, short walks throughout the day. Rest when you get tired. Ask your health care provider what activities are safe for you.  Avoid activity that requires great effort. Also, avoid heavy lifting. Do not lift anything that is heavier than 10 lbs. (4.5 kg), or the limit that your health care provider tells you, until he or she says that it is safe to do so.  Do not douche, use tampons, or have sex until your health care provider approves. General instructions   To prevent or treat constipation while you are taking prescription pain medicine, your health care provider may recommend that  you: ? Drink enough fluid to keep your urine clear or pale yellow. ? Take over-the-counter or prescription medicines. ? Eat foods that are high in fiber, such as fresh fruits and vegetables, whole grains, and beans. ? Limit foods that are high in fat and processed sugars, such as fried and sweet foods.  Take over-the-counter and prescription medicines only as told by your health care provider.  Do not take baths, swim, or use a hot tub until your health care provider approves. Ask your health care provider if you can take showers. You may only be allowed to take sponge baths for bathing.  Wear compression stockings as told by your health care  provider. These stockings help to prevent blood clots and reduce swelling in your legs.  Keep all follow-up visits as told by your health care provider. This is important. Contact a health care provider if:  You have pain when you urinate.  You have pus or a bad smelling discharge coming from your vagina.  You have redness, swelling, or pain around your incision.  You have fluid or blood coming from your incision.  Your incision feels warm to the touch.  You have pus or a bad smell coming from your incision.  You have a fever.  Your incision starts to break open.  You have pain in the abdomen, and it gets worse or does not get better when you take medicine.  You develop a rash.  You develop nausea and vomiting.  You feel lightheaded. Get help right away if:  You develop pain in your chest or leg.  You become short of breath.  You faint.  You have increased bleeding from your vagina. Summary  After the procedure, it is common to have pain, bleeding in the vagina, and symptoms of menopause.  Follow instructions from your health care provider about how to take care of your incision.  Follow instructions from your health care provider about activities and restrictions.  Check your incision every day for signs of infection and  report any symptoms to your health care provider. This information is not intended to replace advice given to you by your health care provider. Make sure you discuss any questions you have with your health care provider. Document Revised: 04/18/2018 Document Reviewed: 03/19/2016 Elsevier Patient Education  2020 Else[employer] Inc.  General Anesthesia, Adult, Care After This sheet gives you information about how to care for yourself after your procedure. Your health care provider may also give you more specific instructions. If you have problems or questions, contact your health care provider. What can I expect after the procedure? After the procedure, the following side effects are common:  Pain or discomfort at the IV site.  Nausea.  Vomiting.  Sore throat.  Trouble concentrating.  Feeling cold or chills.  Weak or tired.  Sleepiness and fatigue.  Soreness and body aches. These side effects can affect parts of the body that were not involved in surgery. Follow these instructions at home:  For at least 24 hours after the procedure:  Have a responsible adult stay with you. It is important to have someone help care for you until you are awake and alert.  Rest as needed.  Do not: ? Participate in activities in which you could fall or become injured. ? Drive. ? Use heavy machinery. ? Drink alcohol. ? Take sleeping pills or medicines that cause drowsiness. ? Make important decisions or sign legal documents. ? Take care of children on your own. Eating and drinking  Follow any instructions from your health care provider about eating or drinking restrictions.  When you feel hungry, start by eating small amounts of foods that are soft and easy to digest (bland), such as toast. Gradually return to your regular diet.  Drink enough fluid to keep your urine pale yellow.  If you vomit, rehydrate by drinking water, juice, or clear broth. General instructions  If you have sleep apnea,  surgery and certain medicines can increase your risk for breathing problems. Follow instructions from your health care provider about wearing your sleep device: ? Anytime you are sleeping, including during daytime naps. ? While taking prescription pain medicines, sleeping medicines, or  medicines that make you drowsy.  Return to your normal activities as told by your health care provider. Ask your health care provider what activities are safe for you.  Take over-the-counter and prescription medicines only as told by your health care provider.  If you smoke, do not smoke without supervision.  Keep all follow-up visits as told by your health care provider. This is important. Contact a health care provider if:  You have nausea or vomiting that does not get better with medicine.  You cannot eat or drink without vomiting.  You have pain that does not get better with medicine.  You are unable to pass urine.  You develop a skin rash.  You have a fever.  You have redness around your IV site that gets worse. Get help right away if:  You have difficulty breathing.  You have chest pain.  You have blood in your urine or stool, or you vomit blood. Summary  After the procedure, it is common to have a sore throat or nausea. It is also common to feel tired.  Have a responsible adult stay with you for the first 24 hours after general anesthesia. It is important to have someone help care for you until you are awake and alert.  When you feel hungry, start by eating small amounts of foods that are soft and easy to digest (bland), such as toast. Gradually return to your regular diet.  Drink enough fluid to keep your urine pale yellow.  Return to your normal activities as told by your health care provider. Ask your health care provider what activities are safe for you. This information is not intended to replace advice given to you by your health care provider. Make sure you discuss any  questions you have with your health care provider. Document Revised: 02/15/2017 Document Reviewed: 09/28/2016 Elsevier Patient Education  2020 [employer].

## 2019-05-18 ENCOUNTER — Other Ambulatory Visit: Payer: Self-pay | Admitting: Obstetrics & Gynecology

## 2019-05-18 ENCOUNTER — Other Ambulatory Visit: Payer: Self-pay

## 2019-05-18 ENCOUNTER — Other Ambulatory Visit (HOSPITAL_COMMUNITY)
Admission: RE | Admit: 2019-05-18 | Discharge: 2019-05-18 | Disposition: A | Discharge status: Home / Self Care | Payer: Medicaid Other | Source: Ambulatory Visit | Attending: Obstetrics & Gynecology | Admitting: Obstetrics & Gynecology

## 2019-05-18 ENCOUNTER — Encounter (HOSPITAL_COMMUNITY)
Admission: RE | Admit: 2019-05-18 | Discharge: 2019-05-18 | Disposition: A | Discharge status: Home / Self Care | Payer: Medicaid Other | Source: Ambulatory Visit | Attending: Obstetrics & Gynecology | Admitting: Obstetrics & Gynecology

## 2019-05-20 ENCOUNTER — Encounter (HOSPITAL_COMMUNITY): Admission: RE | Payer: Self-pay | Source: Home / Self Care

## 2019-05-20 ENCOUNTER — Ambulatory Visit (HOSPITAL_COMMUNITY)
Admission: RE | Admit: 2019-05-20 | Payer: Medicaid Other | Source: Home / Self Care | Admitting: Obstetrics & Gynecology

## 2019-05-20 SURGERY — SALPINGO-OOPHORECTOMY, BILATERAL, LAPAROSCOPIC
Anesthesia: General | Site: Vagina | Laterality: Bilateral

## 2019-05-26 ENCOUNTER — Encounter: Payer: Medicaid Other | Admitting: Obstetrics & Gynecology

## 2019-07-21 ENCOUNTER — Ambulatory Visit: Payer: Medicaid Other | Admitting: Obstetrics & Gynecology

## 2020-02-11 ENCOUNTER — Ambulatory Visit (INDEPENDENT_AMBULATORY_CARE_PROVIDER_SITE_OTHER): Payer: Medicaid Other | Admitting: Obstetrics & Gynecology

## 2020-02-11 ENCOUNTER — Other Ambulatory Visit: Payer: Self-pay

## 2020-02-11 ENCOUNTER — Encounter: Payer: Self-pay | Admitting: Obstetrics & Gynecology

## 2020-02-11 VITALS — BP 119/68 | HR 92 | Ht 60.0 in | Wt 120.0 lb

## 2020-02-11 DIAGNOSIS — R102 Pelvic and perineal pain: Secondary | ICD-10-CM

## 2020-02-11 DIAGNOSIS — Z8742 Personal history of other diseases of the female genital tract: Secondary | ICD-10-CM

## 2020-02-11 MED ORDER — ORILISSA 150 MG PO TABS: 1.0000 | ORAL_TABLET | Freq: Every day | ORAL | 6 refills | Status: DC

## 2020-02-11 NOTE — Progress Notes (Signed)
Follow up appointment for results  No chief complaint on file.   Blood pressure 119/68, pulse 92, height 5' (1.524 m), weight 120 lb (54.4 kg).   [facility] Outside Information      CT ABDOMEN PELVIS W CONTRAST (ROUTINE)  Anatomical Region Laterality Modality  Abdomen -- Computed Tomography  Pelvis -- --    Impression Performed by [facility]  1. Appendix within normal limits.  2. Trace free fluid within the pelvis, favor physiologic.  3. Mild biliary ductal fullness is nonspecific though often incidental postcholecystectomy. Correlate with biliary labs and consider ERCP if elevated.  Narrative Performed by ISITEPOW CT ABDOMEN PELVIS W CONTRAST (ROUTINE), 01/31/2020 6:07 PM   INDICATION: RLQ abdominal pain, appendicitis suspected (Age >= 14y) \ abdominal pain \ R10.84 Abdominal pain, acute, generalized  COMPARISON: None.   TECHNIQUE: Multislice axial images were obtained through the abdomen and pelvis with administration of iodinated intravenous contrast material. Multi-planar reformatted images were generated for additional analysis. Nongated technique limits cardiac detail.   All CT scans at [facility] are performed using dose optimization techniques as appropriate to a performed exam, including but not limited to one or more of the following: automated exposure control, adjustment of the mA and/or kV according to patient size, use of iterative reconstruction technique. In addition, [facility] is participating in the ACR Dose Registry program which will further assist [place] in optimizing patient radiation exposure.    FINDINGS:   LOWER CHEST:  Heart size within normal limits. Lung bases clear.   ABDOMEN & PELVIS:  . Liver: Within normal limits.  . Gallbladder & biliary: Cholecystectomy. Central intrahepatic and extrahepatic biliary ductal fullness to the ampulla.  . Pancreas: Within normal  limits.  . Spleen: Within normal limits.  . Adrenals: Within normal limits.  . Kidneys: Within normal limits.   . GI tract: No overt colitis. Appendix within normal limits. Small bowel loops are of normal caliber.  . Peritoneum/mesentery/extraperitoneum: Trace free fluid, physiologic. No lymphadenopathy or free intraperitoneal air.   Marland Kitchen Ureters: Within normal limits.  . Bladder: Partially decompressed, incompletely assessed.  . Reproductive system: Absent uterus. No adnexal mass. Small physiologic appearing ovarian cysts/follicles.   . Vascular: Within normal limits.   MUSCULOSKELETAL:  Sequelae of injections within the right posterior subcutaneous tissues. No acute osseous finding.  Procedure Note  Rise Patience, MD - 02/01/2020  Formatting of this note might be different from the original.  CT ABDOMEN PELVIS W CONTRAST (ROUTINE), 01/31/2020 6:07 PM   INDICATION: RLQ abdominal pain, appendicitis suspected (Age >= 14y) \ abdominal pain \ R10.84 Abdominal pain, acute, generalized  COMPARISON: None.   TECHNIQUE: Multislice axial images were obtained through the abdomen and pelvis with administration of iodinated intravenous contrast material. Multi-planar reformatted images were generatedfor additional analysis. Nongated technique limits cardiac detail.   All CT scans at [facility] and [facility] are performed using dose optimization techniques as appropriate to a performed exam, including but not limited to one or more of the following: automated exposure control, adjustment of the mA and/or kV according to patient size, use of iterative reconstruction technique. In addition, [facility] is participating in the ACR Dose Registry program which will further assist [place] in optimizing patient radiation exposure.    FINDINGS:   LOWER CHEST:  Heart size within normal limits. Lung bases clear.   ABDOMEN & PELVIS:  . Liver: Within normal limits.   Marland Kitchen  Gallbladder & biliary: Cholecystectomy. Central intrahepatic and extrahepatic biliary ductal fullness to the ampulla.  . Pancreas: Within normal limits.  . Spleen: Within normal limits.  . Adrenals: Within normal limits.  . Kidneys: Within normal limits.   . GI tract: No overt colitis. Appendix within normal limits. Small bowel loops are of normal caliber.  . Peritoneum/mesentery/extraperitoneum: Trace free fluid, physiologic. No lymphadenopathy or free intraperitoneal air.   Marland Kitchen Ureters: Within normal limits.  . Bladder: Partially decompressed, incompletely assessed.  . Reproductive system: Absent uterus. No adnexal mass. Small physiologic appearing ovarian cysts/follicles.   . Vascular: Within normal limits.   MUSCULOSKELETAL:  Sequelae of injections within the right posterior subcutaneous tissues.No acute osseous finding.    CONCLUSION:   1. Appendix within normal limits.  2. Trace free fluid within the pelvis, favor physiologic.  3. Mild biliary ductal fullness is nonspecific though often incidental postcholecystectomy. Correlate with biliary labs and consider ERCP if elevated. Exam End: 01/31/20 6:07 PM   Specimen Collected: 01/31/20 6:11 PM Last Resulted: 02/01/20 7:18 AM  Received From: [facility]  Result Received: 02/11/20 2:47 PM       MEDS ordered this encounter: Meds ordered this encounter  Medications  . Elagolix Sodium (ORILISSA) 150 MG TABS    Sig: Take 1 tablet by mouth daily.    Dispense:  30 tablet    Refill:  6    Orders for this encounter: No orders of the defined types were placed in this encounter.   Impression:   ICD-10-CM   1. Pelvic pain  R10.2   2. History of endometriosis  Z87.42      Plan: Empiric trial of orilissa 150 mg daily  Follow Up: Return in about 2 months (around 04/13/2020) for Follow up, with Dr Despina Hidden.       Face to face time:  20 minutes  Greater than 50% of the visit time was spent  in counseling and coordination of care with the patient.  The summary and outline of the counseling and care coordination is summarized in the note above.   All questions were answered.  Past Medical History:  Diagnosis Date  . Bipolar disorder (HCC)   . Depression   . GERD (gastroesophageal reflux disease)   . Migraines   . Palpitations   . SVT (supraventricular tachycardia) (HCC)     Past Surgical History:  Procedure Laterality Date  . ABDOMINAL HYSTERECTOMY    . CHOLECYSTECTOMY    . TUBAL LIGATION      OB History    Gravida  6   Para  6   Term  6   Preterm      AB      Living  6     SAB      IAB      Ectopic      Multiple      Live Births  6           Allergies  Allergen Reactions  . Amoxicillin Shortness Of Breath, Swelling and Rash    Has patient had a PCN reaction causing immediate rash, facial/tongue/throat swelling, SOB or lightheadedness with hypotension: yes Has patient had a PCN reaction causing severe rash involving mucus membranes or skin necrosis: no Has patient had a PCN reaction that required hospitalization no Has patient had a PCN reaction occurring within the last 10 years: yes If all of the above answers are "NO", then may proceed with Cephalosporin use.  Social History   Socioeconomic History  . Marital status: Married    Spouse name: Not on file  . Number of children: Not on file  . Years of education: Not on file  . Highest education level: Not on file  Occupational History  . Occupation: Disabled  Tobacco Use  . Smoking status: Current Every Day Smoker    Packs/day: 1.00    Years: 10.00    Pack years: 10.00    Types: Cigarettes  . Smokeless tobacco: Never Used  Vaping Use  . Vaping Use: Never used  Substance and Sexual Activity  . Alcohol use: Not Currently    Alcohol/week: 0.0 standard drinks    Comment: occasionally  . Drug use: Not Currently    Types: Marijuana  . Sexual activity: Yes    Birth  control/protection: Surgical    Comment: hyst  Other Topics Concern  . Not on file  Social History Narrative   Married   No regular exercise   Social Determinants of Health   Financial Resource Strain: Not on file  Food Insecurity: Not on file  Transportation Needs: Not on file  Physical Activity: Not on file  Stress: Not on file  Social Connections: Not on file    Family History  Problem Relation Age of Onset  . Heart disease Father   . Heart disease Other   . Other Son        fatty liver  . ADD / ADHD Son   . ADD / ADHD Son   . ADD / ADHD Daughter

## 2020-04-12 ENCOUNTER — Ambulatory Visit: Payer: Medicaid Other | Admitting: Obstetrics & Gynecology

## 2020-05-10 ENCOUNTER — Encounter (HOSPITAL_COMMUNITY): Payer: Self-pay | Admitting: Emergency Medicine

## 2020-05-10 ENCOUNTER — Emergency Department (HOSPITAL_COMMUNITY): Payer: Medicaid Other

## 2020-05-10 ENCOUNTER — Emergency Department (HOSPITAL_COMMUNITY)
Admission: EM | Admit: 2020-05-10 | Discharge: 2020-05-10 | Disposition: A | Discharge status: Home / Self Care | Payer: Medicaid Other | Attending: Emergency Medicine | Admitting: Emergency Medicine

## 2020-05-10 ENCOUNTER — Telehealth: Payer: Self-pay | Admitting: Internal Medicine

## 2020-05-10 ENCOUNTER — Other Ambulatory Visit: Payer: Self-pay

## 2020-05-10 DIAGNOSIS — I951 Orthostatic hypotension: Secondary | ICD-10-CM | POA: Insufficient documentation

## 2020-05-10 DIAGNOSIS — R001 Bradycardia, unspecified: Secondary | ICD-10-CM

## 2020-05-10 DIAGNOSIS — F17210 Nicotine dependence, cigarettes, uncomplicated: Secondary | ICD-10-CM | POA: Diagnosis not present

## 2020-05-10 LAB — URINALYSIS, ROUTINE W REFLEX MICROSCOPIC
Bilirubin Urine: NEGATIVE
Glucose, UA: NEGATIVE mg/dL
Hgb urine dipstick: NEGATIVE
Ketones, ur: NEGATIVE mg/dL
Leukocytes,Ua: NEGATIVE
Nitrite: NEGATIVE
Protein, ur: NEGATIVE mg/dL
Specific Gravity, Urine: 1.021 (ref 1.005–1.030)
pH: 5 (ref 5.0–8.0)

## 2020-05-10 LAB — CBC
HCT: 43.6 % (ref 36.0–46.0)
Hemoglobin: 14 g/dL (ref 12.0–15.0)
MCH: 26.8 pg (ref 26.0–34.0)
MCHC: 32.1 g/dL (ref 30.0–36.0)
MCV: 83.5 fL (ref 80.0–100.0)
Platelets: 239 10*3/uL (ref 150–400)
RBC: 5.22 MIL/uL — ABNORMAL HIGH (ref 3.87–5.11)
RDW: 13.7 % (ref 11.5–15.5)
WBC: 3.6 10*3/uL — ABNORMAL LOW (ref 4.0–10.5)
nRBC: 0 % (ref 0.0–0.2)

## 2020-05-10 LAB — BASIC METABOLIC PANEL
Anion gap: 6 (ref 5–15)
BUN: 10 mg/dL (ref 6–20)
CO2: 24 mmol/L (ref 22–32)
Calcium: 9.2 mg/dL (ref 8.9–10.3)
Chloride: 107 mmol/L (ref 98–111)
Creatinine, Ser: 1.01 mg/dL — ABNORMAL HIGH (ref 0.44–1.00)
GFR, Estimated: >60 mL/min (ref >60)
Glucose, Bld: 98 mg/dL (ref 70–99)
Potassium: 3.9 mmol/L (ref 3.5–5.1)
Sodium: 137 mmol/L (ref 135–145)

## 2020-05-10 LAB — I-STAT BETA HCG BLOOD, ED (MC, WL, AP ONLY): I-stat hCG, quantitative: <5 m[IU]/mL (ref <5)

## 2020-05-10 LAB — CBG MONITORING, ED: Glucose-Capillary: 105 mg/dL — ABNORMAL HIGH (ref 70–99)

## 2020-05-10 MED ORDER — DILTIAZEM HCL ER COATED BEADS 120 MG PO CP24: 120.0000 mg | ORAL_CAPSULE | Freq: Every day | ORAL | 2 refills | Status: DC

## 2020-05-10 NOTE — ED Provider Notes (Signed)
[facility] EMERGENCY DEPARTMENT Provider Note   CSN: [id number] Arrival date & time: 05/10/20  1857     History Chief Complaint  Patient presents with  . Bradycardia  . Chest Pain  . Dizziness    Gwendolyn [name] is a 40 y.o. female with a history of SVT on diltiazem presenting to emergency department with bradycardia and lightheadedness.  The patient reports she has been on 240 mg diltiazem for months due to recurrent SVT episodes.  She wore a Holter monitor which confirmed this.  She said she had a very mild case of Covid in January when the rest of her family was sick.  Subsequently she has had episodes where she feels like her heart rate is too slow and her blood pressure has been low.  She specifically reports she gets lightheaded when she stands up after sitting for a while.  She has been taking the same dose of Cardizem in the morning.  Cardiologist is Dr Johney Frame.  She was seen at OSH yesterday, [facility], per care everywhere records had negative troponin, unremarkable CBC and CMP, discharged home.  She returns today because she is concerned that she continues to feel lightheaded at times.  She called cardiology office was told to come to the ER to "get checked out" for her "low heart rate."  No syncope at home.  No CP or SOB.  HPI     Past Medical History:  Diagnosis Date  . Bipolar disorder (HCC)   . Depression   . GERD (gastroesophageal reflux disease)   . Migraines   . Palpitations   . SVT (supraventricular tachycardia) [facility])     Patient Active Problem List   Diagnosis Date Noted  . Marijuana abuse   . Opioid use disorder, moderate, dependence (HCC) 04/15/2015  . Bipolar disorder with depression (HCC) 04/15/2015  . Bipolar 1 disorder, depressed, severe (HCC) 04/15/2015  . Pre-operative cardiovascular examination 08/25/2014  . H/O sinus tachycardia 08/25/2014  . Tobacco abuse   . Palpitations   . SVT (supraventricular tachycardia) (HCC)    . Bipolar disorder (HCC)   . Depression   . Migraines   . GERD (gastroesophageal reflux disease)   . Ejection fraction     Past Surgical History:  Procedure Laterality Date  . ABDOMINAL HYSTERECTOMY    . CHOLECYSTECTOMY    . TUBAL LIGATION       OB History    Gravida  6   Para  6   Term  6   Preterm      AB      Living  6     SAB      IAB      Ectopic      Multiple      Live Births  6           Family History  Problem Relation Age of Onset  . Heart disease Father   . Heart disease Other   . Other Son        fatty liver  . ADD / ADHD Son   . ADD / ADHD Son   . ADD / ADHD Daughter     Social History   Tobacco Use  . Smoking status: Current Every Day Smoker    Packs/day: 1.00    Years: 10.00    Pack years: 10.00    Types: Cigarettes  . Smokeless tobacco: Never Used  Vaping Use  . Vaping Use: Never used  Substance Use  Topics  . Alcohol use: Not Currently    Alcohol/week: 0.0 standard drinks    Comment: occasionally  . Drug use: Not Currently    Types: Marijuana    Home Medications Prior to Admission medications   Medication Sig Start Date End Date Taking? Authorizing Provider  diazepam (VALIUM) 10 MG tablet Take 10 mg by mouth 3 (three) times daily.   Yes [provider]  diltiazem (CARDIZEM CD) 120 MG 24 hr capsule Take 1 capsule (120 mg total) by mouth daily for 30 doses. 05/10/20 06/09/20 Yes Jamaree Hosier, Kermit Balo, MD  diltiazem [facility]) 240 MG 24 hr capsule Take 120 mg by mouth daily.   Yes [provider]  omeprazole (PRILOSEC) 40 MG capsule Take 40 mg by mouth daily.   Yes [provider]  sulfamethoxazole-trimethoprim (BACTRIM DS) 800-160 MG tablet Take 1 tablet by mouth 2 (two) times daily.   Yes [provider]  Elagolix Sodium (ORILISSA) 150 MG TABS Take 1 tablet by mouth daily. Patient not taking: Reported on 05/10/2020 02/11/20   Lazaro Arms, MD  omeprazole (PRILOSEC) 20 MG capsule Take 1  capsule (20 mg total) by mouth daily. For acid reflux Patient not taking: Reported on 05/10/2020 04/21/15   Armandina Stammer I, NP    Allergies    Amoxicillin  Review of Systems   Review of Systems  Constitutional: Negative for chills and fever.  HENT: Negative for ear pain and sore throat.   Eyes: Negative for pain and visual disturbance.  Respiratory: Negative for cough and shortness of breath.   Cardiovascular: Positive for palpitations. Negative for chest pain.  Gastrointestinal: Negative for abdominal pain and vomiting.  Genitourinary: Negative for dysuria and hematuria.  Musculoskeletal: Negative for arthralgias and back pain.  Skin: Negative for color change and rash.  Neurological: Positive for light-headedness. Negative for syncope.  All other systems reviewed and are negative.   Physical Exam Updated Vital Signs BP 102/66   Pulse (!) 50   Temp 98.6 F (37 C)   Resp 11   LMP  (LMP Unknown)   SpO2 100%   Physical Exam Constitutional:      General: She is not in acute distress. HENT:     Head: Normocephalic and atraumatic.  Eyes:     Conjunctiva/sclera: Conjunctivae normal.     Pupils: Pupils are equal, round, and reactive to light.  Cardiovascular:     Rate and Rhythm: Regular rhythm. Bradycardia present.     Comments: HR 55-60 bpm Pulmonary:     Effort: Pulmonary effort is normal. No respiratory distress.  Abdominal:     General: There is no distension.  Skin:    General: Skin is warm and dry.  Neurological:     General: No focal deficit present.     Mental Status: She is alert. Mental status is at baseline.  Psychiatric:        Mood and Affect: Mood normal.        Behavior: Behavior normal.     ED Results / Procedures / Treatments   Labs (all labs ordered are listed, but only abnormal results are displayed) Labs Reviewed  BASIC METABOLIC PANEL - Abnormal; Notable for the following components:      Result Value   Creatinine, Ser 1.01 (*)    All  other components within normal limits  CBC - Abnormal; Notable for the following components:   WBC 3.6 (*)    RBC 5.22 (*)    All other components within  normal limits  CBG MONITORING, ED - Abnormal; Notable for the following components:   Glucose-Capillary 105 (*)    All other components within normal limits  URINALYSIS, ROUTINE W REFLEX MICROSCOPIC  I-STAT BETA HCG BLOOD, ED ([facility], [facility], [facility] ONLY)  CBG MONITORING, ED    EKG EKG Interpretation  Date/Time:  Tuesday May 10 2020 19:03:15 EDT Ventricular Rate:  77 PR Interval:  130 QRS Duration: 82 QT Interval:  374 QTC Calculation: 423 R Axis:   97 Text Interpretation: Normal sinus rhythm Rightward axis Borderline ECG No STEMI Confirmed by Alvester Chou [phone]) on 05/10/2020 8:04:05 PM   Radiology DG Chest 2 View  Result Date: 05/10/2020 CLINICAL DATA:  Left-sided chest tightness and cough. EXAM: CHEST - 2 VIEW COMPARISON:  Jun 27, 2008 FINDINGS: The heart size and mediastinal contours are within normal limits. Both lungs are clear. Radiopaque surgical clips are seen within the right upper quadrant. The visualized skeletal structures are unremarkable. IMPRESSION: No active cardiopulmonary disease. Electronically Signed   By: Aram Candela M.D.   On: 05/10/2020 19:50      Medications Ordered in ED Medications - No data to display  ED Course  I have reviewed the triage vital signs and the nursing notes.  Pertinent labs & imaging results that were available during my care of the patient were reviewed by me and considered in my medical decision making (see chart for details).  DDx includes arrhythmia vs anemia vs dehydration/orthostatic hypotension vs long covid syndrome vs other  This could also very likely be iatrogenic hypotension from her diltiazem.  She describes specifically lightheadedness with standing.  Less likely PE with chronicity of symptoms, no chest pain, no hypoxia  Labs ordered and reviewed - glucose wnl,  UA unremarkable, CBC and BMP unremarkable.  Outside records reviewed -troponin normal in ED yesterday - doubt this is ACS.  Also reviewed holter monitor report - appears to have had several episodes of sinus tachycardia in the past.  Tele reviewed - sinus bradycardia, HR 55-60 bpm. ECG without evidence of heart block, no acute ischemic findings  Less likely ACS or anemia per this workup.  She has minimal symptoms in the ED.  I advised reducing her dilt dose in half and contacting her cardiologist tomorrow to follow up on this issue.  Also advised water hydration at home.  Okay for discharge.     Final Clinical Impression(s) / ED Diagnoses Final diagnoses:  Orthostatic hypotension  Bradycardia    Rx / DC Orders ED Discharge Orders         Ordered    diltiazem (CARDIZEM CD) 120 MG 24 hr capsule  Daily        05/10/20 2207           Terald Sleeper, MD 05/11/20 [phone]

## 2020-05-10 NOTE — Telephone Encounter (Signed)
STAT if HR is under 50 or over 120 (normal HR is 60-100 beats per minute)  1) What is your heart rate? 60. Pt is sitting up.  When patient lays down it goes down to 50-55  2) Do you have a log of your heart rate readings (document readings)? Not usually.   3) Do you have any other symptoms?  Not now.   Patient went to the ER yesterday for shoulder pain that radiated into her chest yesterday. While she was in the ER her HR was low and she was advised to let her Cardiologist know what was going on.

## 2020-05-10 NOTE — Telephone Encounter (Signed)
Patient called back to follow up she very concerned about her heart rate and would like for the nurse to call her back today. Patient also state that she is scared to go to sleep because heart rate is low

## 2020-05-10 NOTE — Telephone Encounter (Signed)
Pt returned call stating she laid down to rest and her HR dropped to 52 and now she has a headache

## 2020-05-10 NOTE — Discharge Instructions (Signed)
I advised that you cut your diltiazem dose in half.  I prescribed you new medication for 120 mg tablet daily.  Your low heart rate and low blood pressure may be due to oversensitivity to this medication.  You should also continue drinking lots of water to keep hydrated.  Some mild dehydration could be causing some of the symptoms as well.  Please call your cardiologist office tomorrow to arrange for a faster follow-up.  Please see if they can see you in the office this week.  Your cardiologist should ultimately be making the decision about your heart rate medications and further work-up for your heart.

## 2020-05-10 NOTE — Telephone Encounter (Signed)
Pt says her HR now is 84 and when she lays down it drops in the 50s - c/o dizziness/being lightheaded - pt aware that is she is having active symptoms of dizziness associated with low HR she should have evaluation at urgent care or ED - pt voiced understanding

## 2020-05-10 NOTE — ED Triage Notes (Signed)
Patient seen last night at [facility] for same, states she has SVT and takes cardizem.  She states that her HR is 50 and below, especially when she lays down.  She states she does have a tightness in the chest, no nausea, no vomiting, no shortness of breath.  She does have dizziness when she stands up.

## 2020-05-10 NOTE — Telephone Encounter (Signed)
Pt went to [facility] ED [facility]) yesterday for shoulder pain and was told her HR was 50-55 and should consult cardiology - pt denies SOB/dizziness/palpitations - c/o weakness/fatigue especially when laying down and "feels" her HR drop in the 50s -  HR now is 65 - has appt with Dr Johney Frame 3/30 - currently taking dilt 240 mg daily

## 2020-05-12 NOTE — Telephone Encounter (Signed)
Pt went to ED [facility]) and has appt with Dr Johney Frame tomorrow 05/13/2020

## 2020-05-13 ENCOUNTER — Ambulatory Visit: Payer: Medicaid Other | Admitting: Internal Medicine

## 2020-05-13 ENCOUNTER — Encounter: Payer: Self-pay | Admitting: Internal Medicine

## 2020-05-13 ENCOUNTER — Other Ambulatory Visit: Payer: Self-pay

## 2020-05-13 VITALS — BP 102/60 | HR 75 | Ht 60.0 in | Wt 125.2 lb

## 2020-05-13 DIAGNOSIS — I471 Supraventricular tachycardia: Secondary | ICD-10-CM

## 2020-05-13 NOTE — Patient Instructions (Addendum)
Medication Instructions:  Your physician recommends that you continue on your current medications as directed. Please refer to the Current Medication list given to you today.  Labwork: None ordered.  Testing/Procedures: Your physician has requested that you have an echocardiogram. Echocardiography is a painless test that uses sound waves to create images of your heart. It provides your doctor with information about the size and shape of your heart and how well your heart's chambers and valves are working. This procedure takes approximately one hour. There are no restrictions for this procedure.  Follow-Up: Your physician wants you to follow-up in: 6 months with [name], MD    Any Other Special Instructions Will Be Listed Below (If Applicable).  If you need a refill on your cardiac medications before your next appointment, please call your pharmacy.

## 2020-05-13 NOTE — Progress Notes (Signed)
   PCP: Richardean Chimera, MD Primary Cardiologist: Dr Diona Browner Primary EP: Dr Langley Adie is a 40 y.o. female who presents today for routine electrophysiology followup.  Since last being seen in our clinic, the patient reports doing reasonably well.  She was seen in the ED 05/10/20 for "low heart rate".  She had sinus bradycardia in the 50s.  Her diltiazem was reduced.  She has not had SVT in over a year.  She is quite anxiuos.  Today, she denies symptoms of palpitations, chest pain, shortness of breath,  lower extremity edema, dizziness, presyncope, or syncope.  The patient is otherwise without complaint today.   Past Medical History:  Diagnosis Date  . Bipolar disorder (HCC)   . Depression   . GERD (gastroesophageal reflux disease)   . Migraines   . Palpitations   . SVT (supraventricular tachycardia) (HCC)    short RP SVT documented   Past Surgical History:  Procedure Laterality Date  . ABDOMINAL HYSTERECTOMY    . CHOLECYSTECTOMY    . TUBAL LIGATION      ROS- all systems are reviewed and negatives except as per HPI above  Current Outpatient Medications  Medication Sig Dispense Refill  . diazepam (VALIUM) 10 MG tablet Take 10 mg by mouth 3 (three) times daily.    Marland Kitchen diltiazem (CARDIZEM CD) 120 MG 24 hr capsule Take 1 capsule (120 mg total) by mouth daily for 30 doses. 30 capsule 2  . omeprazole (PRILOSEC) 40 MG capsule Take 40 mg by mouth daily.     No current facility-administered medications for this visit.    Physical Exam: Vitals:   05/13/20 1527  BP: 102/60  Pulse: 75  SpO2: 98%  Weight: 125 lb 3.2 oz (56.8 kg)  Height: 5' (1.524 m)    GEN- The patient is well appearing, alert and oriented x 3 today.   Head- normocephalic, atraumatic Eyes-  Sclera clear, conjunctiva pink Ears- hearing intact Oropharynx- clear Lungs- Clear to ausculation bilaterally, normal work of breathing Heart- Regular rate and rhythm, no murmurs, rubs or gallops, PMI not laterally  displaced GI- soft, NT, ND, + BS Extremities- no clubbing, cyanosis, or edema  Wt Readings from Last 3 Encounters:  05/13/20 125 lb 3.2 oz (56.8 kg)  02/11/20 120 lb (54.4 kg)  04/27/19 128 lb (58.1 kg)    EKG tracing ordered today is personally reviewed and shows sinus rhythm 75 bpm, PR 126 msec, QRS 90 msec, Qtc 426 msec  Assessment and Plan:  1. SVT Today, I have reviewed strips from EMS which she brings from 02/2019.  These will be scanned into epic.  They document SVT. Therapeutic strategies for supraventricular tachycardia including medicine and ablation were discussed in detail with the patient today. Risk, benefits, and alternatives to EP study and radiofrequency ablation were also discussed in detail today. These risks include but are not limited to stroke, bleeding, vascular damage, tamponade, perforation, damage to the heart and other structures, AV block requiring pacemaker, worsening renal function, and death. The patient understands these risk and wishes to continue medical therapy for now  2. Sinus bradycardia Improved with recently reduced diltiazem CD to 120mg  daily  Return to see me in 6 months  MD, [facility] 05/13/2020 4:10 PM

## 2020-05-14 ENCOUNTER — Telehealth: Payer: Self-pay | Admitting: Internal Medicine

## 2020-05-14 NOTE — Telephone Encounter (Signed)
Cardiology Moonlighter Note  Returned page from patient. She is feeling very tired. This has been going on yesterday and today. Feels like she just wants to sleep all the time. Has a bad headache. Sleep patterns today have been very erratic. HR's have been in the 70's all day today. Does not have a blood pressure machine to check BP at home. Has not had any bradycardic episodes that she has noticed. She has been taking all of her medicines as prescribed. Has been taking diltiazem 120mg  daily as prescribed. She denies fevers, chills, blood in stool, cough, falls, syncope, presyncope, dizziness.   I recommended patient find an automatic BP cuff to check her BP. Reach back out to on call cardiology if BP abnormal. Otherwise rest an supportive care tonight. Will send message to patient's cardiologist.   , MD Cardiology Fellow, PGY-8

## 2020-05-17 NOTE — Addendum Note (Signed)
Addended by: Solon Augusta on: 05/17/2020 09:04 AM   Modules accepted: Orders

## 2020-05-25 ENCOUNTER — Ambulatory Visit: Payer: Medicaid Other | Admitting: Internal Medicine

## 2020-05-31 ENCOUNTER — Encounter: Payer: Self-pay | Admitting: Nurse Practitioner

## 2020-06-13 ENCOUNTER — Ambulatory Visit (HOSPITAL_COMMUNITY): Payer: Medicaid Other

## 2020-06-16 ENCOUNTER — Ambulatory Visit: Payer: Medicaid Other | Admitting: Nurse Practitioner

## 2020-07-04 ENCOUNTER — Encounter: Payer: Self-pay | Admitting: Physician Assistant

## 2020-07-04 ENCOUNTER — Other Ambulatory Visit: Payer: Medicaid Other

## 2020-07-04 ENCOUNTER — Ambulatory Visit: Payer: Medicaid Other | Admitting: Physician Assistant

## 2020-07-04 VITALS — BP 102/62 | HR 89 | Ht 60.0 in | Wt 135.6 lb

## 2020-07-04 DIAGNOSIS — G8929 Other chronic pain: Secondary | ICD-10-CM | POA: Diagnosis not present

## 2020-07-04 DIAGNOSIS — R1013 Epigastric pain: Secondary | ICD-10-CM | POA: Diagnosis not present

## 2020-07-04 DIAGNOSIS — K5900 Constipation, unspecified: Secondary | ICD-10-CM | POA: Diagnosis not present

## 2020-07-04 DIAGNOSIS — R102 Pelvic and perineal pain: Secondary | ICD-10-CM | POA: Diagnosis not present

## 2020-07-04 NOTE — Progress Notes (Signed)
Subjective:    Patient ID: Gwendolyn [name], female    DOB: [birth year], 40 y.o.   MRN: [id number]  HPI  Gwendolyn [name] is a pleasant 40 year old white female, new to GI today [facility]/Dr. Reuel Boom for evaluation of abdominal pain. Patient has not had prior GI evaluation.  She relates that she did have cholecystectomy done about 10 years ago and is also had bilateral tubal ligation and partial hysterectomy. She gives history of endometriosis which apparently was diagnosed about 10 years ago.  She has not ever had a laparoscopy.  She also has history of bipolar disorder, depression, and SVT. She has history of GERD but says she uses omeprazole 20 mg on a as needed basis. She says that she has had lower abdominal/pelvic pain for years and that this really has not changed.  She describes it as a burning pressure-like constant discomfort in her lower abdomen.  More recently she had been having issues with constipation.  After seeing primary care she was given a MiraLAX purge and then asked to start taking MiraLAX on a daily basis.  She says that the MiraLAX purge helped a lot, and since that time she has focused more on eating healthy, adding a lot of fiber in her diet drinking more water etc. and says that her bowels have been pretty much back to normal and that she has not been requiring MiraLAX on a daily basis.  She has not noted any melena or hematochezia.  Around that time she had lost some weight but says she has now gained about 10 pounds over the past few months. She relates some burning in her epigastrium on an empty stomach which seems to be better after p.o. intake, this has been present for about 6 months, again taking omeprazole on a as needed basis.  She denies any regular EtOH, denies aspirin or NSAIDs. Labs from 06/23/2020 normal LFTs, hemoglobin 14.3/hematocrit of 43 She did have a CT of the abdomen pelvis done in December 2021 through [facility] which showed mild  biliary ductal fullness felt incidental status postcholecystectomy, otherwise negative exam.  Patient says she does have follow-up scheduled with her gynecologist in about a week.  She feels that most of her pelvic pain is likely from endometriosis.  She is wondering about having a complete hysterectomy and plans to discuss this with GYN.  Family history is negative for colon cancer and polyps as far she is aware.  Review of Systems Pertinent positive and negative review of systems were noted in the above HPI section.  All other review of systems was otherwise negative.  Outpatient Encounter Medications as of 07/04/2020  Medication Sig  . diazepam (VALIUM) 10 MG tablet Take 10 mg by mouth 3 (three) times daily.  Marland Kitchen diltiazem (CARDIZEM CD) 120 MG 24 hr capsule Take 1 capsule (120 mg total) by mouth daily for 30 doses.  Marland Kitchen omeprazole (PRILOSEC) 40 MG capsule Take 40 mg by mouth as needed.   No facility-administered encounter medications on file as of 07/04/2020.   Allergies  Allergen Reactions  . Amoxicillin Shortness Of Breath, Swelling and Rash    Has patient had a PCN reaction causing immediate rash, facial/tongue/throat swelling, SOB or lightheadedness with hypotension: yes Has patient had a PCN reaction causing severe rash involving mucus membranes or skin necrosis: no Has patient had a PCN reaction that required hospitalization no Has patient had a PCN reaction occurring within the last 10 years: yes If all of the  above answers are "NO", then may proceed with Cephalosporin use.  Marland Kitchen Keflex [Cephalexin]     "I don't feel normal"   Patient Active Problem List   Diagnosis Date Noted  . Marijuana abuse   . Opioid use disorder, moderate, dependence (HCC) 04/15/2015  . Bipolar disorder with depression (HCC) 04/15/2015  . Bipolar 1 disorder, depressed, severe (HCC) 04/15/2015  . Pre-operative cardiovascular examination 08/25/2014  . H/O sinus tachycardia 08/25/2014  . Tobacco abuse   .  Palpitations   . SVT (supraventricular tachycardia) (HCC)   . Bipolar disorder (HCC)   . Depression   . Migraines   . GERD (gastroesophageal reflux disease)   . Ejection fraction    Social History   Socioeconomic History  . Marital status: Married    Spouse name: Not on file  . Number of children: Not on file  . Years of education: Not on file  . Highest education level: Not on file  Occupational History  . Occupation: Disabled  Tobacco Use  . Smoking status: Former Smoker    Years: 10.00    Types: Cigarettes    Quit date: 05/24/2020    Years since quitting: 0.1  . Smokeless tobacco: Never Used  Vaping Use  . Vaping Use: Never used  Substance and Sexual Activity  . Alcohol use: Not Currently    Alcohol/week: 0.0 standard drinks  . Drug use: Not Currently    Types: Marijuana    Comment: previous history of marijuana use  . Sexual activity: Yes    Birth control/protection: Surgical    Comment: hyst  Other Topics Concern  . Not on file  Social History Narrative   Married   No regular exercise   Social Determinants of Health   Financial Resource Strain: Not on file  Food Insecurity: Not on file  Transportation Needs: Not on file  Physical Activity: Not on file  Stress: Not on file  Social Connections: Not on file  Intimate Partner Violence: Not on file    Gwendolyn [name] family history includes ADD / ADHD in her daughter, son, and son; Esophageal cancer in her maternal uncle; Heart disease in her father and another family member; Liver disease in her maternal grandfather; Other in her son.      Objective:    Vitals:   07/04/20 1037  BP: 102/62  Pulse: 89  SpO2: 99%    Physical Exam Well-developed well-nourished  W female in no acute distress.  Height, GEXBMW,413 BMI  26.4 HEENT; nontraumatic normocephalic, EOMI, PE R LA, sclera anicteric. Oropharynx;not done Neck; supple, no JVD Cardiovascular; regular rate and rhythm with S1-S2, no murmur rub or  gallop Pulmonary; Clear bilaterally Abdomen; soft, no focal tenderness, nondistended, no palpable mass or hepatosplenomegaly, bowel sounds are active, Rectal;not done today Skin; benign exam, no jaundice rash or appreciable lesions Extremities; no clubbing cyanosis or edema skin warm and dry Neuro/Psych; alert and oriented x4, grossly nonfocal mood and affect appropriate       Assessment & Plan:   #10 40 year old white female with chronic pelvic pain times several years with no recent change.  Described as burning and pressure-like and constant. Negative CT abdomen and pelvis December 2021/[facility]  Patient gives history of diagnosis of endometriosis, she has not been on any treatment or had laparoscopy Suspect symptoms secondary to endometriosis  #2 status post remote cholecystectomy #3 recent constipation and some associated weight loss.  She has gained 10 pounds over the past couple of months, significant improvement  in bowel habits after a bowel purge and alteration in her diet and water intake.  She has not currently constipated and has been having normal bowel movements. No melena or hematochezia  #3 status post bilateral tubal ligation and partial hysterectomy  #4 bipolar disorder with history of depression #5 SVT #7 recent mild epigastric pain/burning improved with p.o. intake-suspect gastritis, cannot rule out peptic ulcer disease  Plan; advised patient to continue healthy diet, high-fiber with at least 60 ounces of water daily Use MiraLAX 17 g in 8 ounces of water on a as needed basis for constipation Start taking omeprazole 20 mg p.o. every morning over the next 6 to 8 weeks for probable gastritis. Will check H. pylori stool antigen (she has not been on chronic PPI) to rule out H. Pylori. We discussed possible colonoscopy and EGD.  I think colonoscopy will be low yield as she had negative CT and her symptoms have been chronic over several years.  She would like to  have for follow-up with gynecology and discuss endometriosis and management further.  She will call back after that visit if she wants to pursue colonoscopy and/or EGD which I do not think is critical at this time. Patient will be established with Dr. Myrtie Neither. We will plan office follow-up on an as-needed basis with Dr. Myrtie Neither or myself  Sharonlee Nine Oswald Hillock PA-C 07/04/2020   Cc: Richardean Chimera, MD

## 2020-07-04 NOTE — Patient Instructions (Addendum)
If you are age 41 or older, your body mass index should be between 23-30. Your Body mass index is 26.48 kg/m. If this is out of the aforementioned range listed, please consider follow up with your Primary Care Provider.  If you are age 14 or younger, your body mass index should be between 19-25. Your Body mass index is 26.48 kg/m. If this is out of the aformentioned range listed, please consider follow up with your Primary Care Provider.   Your provider has requested that you go to the basement level for lab work before leaving today. Press "B" on the elevator. The lab is located at the first door on the left as you exit the elevator.  After you have completed your H. Pylori antigen test, start taking your Omeprazole daily.  Continue a healthy diet, high fiber and at least 60 ounces of water daily .  Use Miralax 17 grams/capful in 8 ounces of water or juice daily as needed for constipation.   Call the office after your GYN visit if you want to schedule for a Colonoscopy/Endoscopy.  Thank you for entrusting me with your care and choosing [facility].  Amy Esterwood, PA-C

## 2020-07-06 NOTE — Progress Notes (Signed)
____________________________________________________________  Attending physician addendum:  Thank you for sending this case to me. I have reviewed the entire note and agree with the plan.  If gynecology uncertain about proceeding with laparoscopy or hysterectomy, then EGD and colonoscopy reasonable to help rule out various organic GI diagnoses first.  Amada Jupiter, MD  ____________________________________________________________

## 2020-07-12 ENCOUNTER — Other Ambulatory Visit: Payer: Self-pay

## 2020-07-12 ENCOUNTER — Ambulatory Visit (HOSPITAL_COMMUNITY): Payer: Medicaid Other | Attending: Cardiology

## 2020-07-12 DIAGNOSIS — I471 Supraventricular tachycardia: Secondary | ICD-10-CM | POA: Diagnosis not present

## 2020-07-12 LAB — ECHOCARDIOGRAM COMPLETE
Area-P 1/2: 2.86 cm2
S' Lateral: 2.75 cm

## 2020-07-13 ENCOUNTER — Telehealth: Payer: Self-pay | Admitting: Internal Medicine

## 2020-07-13 NOTE — Telephone Encounter (Signed)
Follow up:    Patient returning a call back for results 

## 2020-07-13 NOTE — Telephone Encounter (Signed)
See result note.  

## 2020-09-09 ENCOUNTER — Telehealth: Payer: Self-pay | Admitting: Internal Medicine

## 2020-09-09 NOTE — Telephone Encounter (Signed)
Reports elevated HR up to 130 when out of bed. Reports HR resting 105 and O2 Sat 90 % last night. Has not checked BP. Took valium and says HR came down. Reports taking diltiazem 120 mg daily. Reports a little dizziness. Reports intermittent chest pain 5/10 for the past week. Denies SOB. Denies active chest pain. Resting HR while on phone is 80 and O2 Sat 99 %. Advised that she needed to contact her PCP with symptoms since she had positive response to valium and could be related to anxiety. Gave first available appointment to see [name], NP 10/04/20 and advised if symptoms got worse, to go to the ED for an evaluation. Verbalized understanding of plan.

## 2020-09-09 NOTE — Telephone Encounter (Signed)
Per phone call from pt- while at rest her heart rate is 110 at this time. She can't get it below 100 and it's been up as high as 120 while at rest. States no chest pain, no shortness of breath -Oxygen a few days was 90 - feels a little dizzy when she's walking through the house.    Does not want to see Dr. Johney Frame   Please give pt a call @ [phone]

## 2020-10-02 NOTE — Progress Notes (Signed)
Cardiology Office Note  Date: 10/02/2020   ID: Gwendolyn [name], DOB 1980/11/25, MRN [id number]  PCP:  Richardean Chimera, MD  Cardiologist:  None Electrophysiologist:  None   Chief Complaint: Chest pain / elevated HR  History of Present Illness: Gwendolyn [name] is a 40 y.o. female with a history of SVT, palpitations, GERD, Depression, Bipolar d/o.   She presented to [facility] emergency department on 05/10/2020 for bradycardia and lightheadedness.  She was having episodes where she felt like her heart rate was too slow and her blood pressure had been low.  She reported getting lightheaded when when standing up after sitting for a while.  She had been seen at an outside hospital the day prior [facility]) and had negative troponins, unremarkable CBC and CMP and was discharged home.  EKG showed normal sinus rhythm with rightward axis borderline EKG no STEMI.  Ventricular rate was 77.  She was last seen by Dr. Johney Frame on 05/13/2020.  She had been seen in ED on 05/10/2020 for low heart rate.  She had sinus bradycardia in the 50s.  Her diltiazem dose was reduced.  She had not had SVT in over a year.  She was anxious during the visit.  She denied any symptoms of palpitations, chest pain, shortness of breath, edema, dizziness, presyncope or syncope.  Dr. Johney Frame discussed therapeutic strategies for SVT including medicine and an ablation.  Risk and benefits and alternatives to EP study and RF ablation were all discussed.  The patient understood risk and benefits and wished to continue medical therapy.  Sinus bradycardia had improved after reducing diltiazem CD to 120 mg/day. Echocardiogram was ordered. Plan was for follow-up in 6 months.  Patient is here for recent complaints of elevated heart rate and chest pain after taking azithromycin.  She states after azithromycin she started having increased heart rates.  She does have a history of significant anxiety on Valium.  She states she was recently  prescribed Ativan to transition from Valium.  She states she has had some issues with bradycardia in the past also.  Heart rate today is 76 and regular.  Blood pressure is well controlled.  She is currently on diltiazem CD 120 mg for palpitations/SVT.  She states she is noticing less and less palpitations since she stopped taking the azithromycin.  We discussed possible as needed Cardizem 30 mg for recurrent palpitations.  She is concerned if she starts Cardizem as needed in addition to beginning Ativan it may slow her heart rate.  She complains of some weight gain recently.  It appears she has gained some weight since March of this year when she weighed 125.  Weight today is 155.  Dr. Johney Frame had ordered an echocardiogram.  Echocardiogram on 07/12/2020 demonstrated EF of 55 to 60%.  No WMA's, normal diastolic parameters, normal RV function, normal pulmonary pressures.  Trivial MR.  She has no demonstrable clinical symptoms of heart failure rather than increase in weight.  Denies any DOE or SOB.  No lower extremity edema.  She has a significant stressor in her grandfather who has heart failure, cancer, and symptoms of dementia which she lives with who is causing some increased anxiety in addition to her baseline anxiety.  EKG today shows normal sinus rhythm with a rate of 76.  She states she had some palpitations around the time of the EKG being recorded but she stated she was sure it did not show on the EKG.   Past Medical  History:  Diagnosis Date   Bipolar disorder (HCC)    Depression    GERD (gastroesophageal reflux disease)    Migraines    Palpitations    SVT (supraventricular tachycardia) (HCC)    short RP SVT documented    Past Surgical History:  Procedure Laterality Date   ABDOMINAL HYSTERECTOMY     CHOLECYSTECTOMY     TUBAL LIGATION      Current Outpatient Medications  Medication Sig Dispense Refill   diazepam (VALIUM) 10 MG tablet Take 10 mg by mouth 3 (three) times daily.      diltiazem (CARDIZEM CD) 120 MG 24 hr capsule Take 1 capsule (120 mg total) by mouth daily for 30 doses. 30 capsule 2   omeprazole (PRILOSEC) 40 MG capsule Take 40 mg by mouth as needed.     No current facility-administered medications for this visit.   Allergies:  Amoxicillin and Keflex [cephalexin]   Social History: The patient  reports that she quit smoking about 4 months ago. Her smoking use included cigarettes. She has never used smokeless tobacco. She reports previous alcohol use. She reports previous drug use. Drug: Marijuana.   Family History: [name] family history includes ADD / ADHD in her daughter, son, and son; Esophageal cancer in her maternal uncle; Heart disease in her father and another family member; Liver disease in her maternal grandfather; Other in her son.   ROS:  Please see the history of present illness. Otherwise, complete review of systems is positive for none.  All other systems are reviewed and negative.   Physical Exam: VS:  LMP  (LMP Unknown) , BMI There is no height or weight on file to calculate BMI.  Wt Readings from Last 3 Encounters:  07/04/20 135 lb 9.6 oz (61.5 kg)  05/13/20 125 lb 3.2 oz (56.8 kg)  02/11/20 120 lb (54.4 kg)    General: Patient appears comfortable at rest. HEENT: Conjunctiva and lids normal, oropharynx clear with moist mucosa. Neck: Supple, no elevated JVP or carotid bruits, no thyromegaly. Lungs: Clear to auscultation, nonlabored breathing at rest. Cardiac: Regular rate and rhythm, no S3 or significant systolic murmur, no pericardial rub. Abdomen: Soft, nontender, no hepatomegaly, bowel sounds present, no guarding or rebound. Extremities: No pitting edema, distal pulses 2+. Skin: Warm and dry. Musculoskeletal: No kyphosis. Neuropsychiatric: Alert and oriented x3, affect grossly appropriate.  ECG: October 03, 2020 normal sinus rhythm rate of 76  Recent Labwork: 05/10/2020: BUN 10; Creatinine, Ser 1.01; Hemoglobin 14.0;  Platelets 239; Potassium 3.9; Sodium 137  No results found for: CHOL, TRIG, HDL, CHOLHDL, VLDL, LDLCALC, LDLDIRECT  Other Studies Reviewed Today:  Echocardiogram 07/12/2020  1. Left ventricular ejection fraction, by estimation, is 55 to 60%. The left ventricle has normal function. The left ventricle has no regional wall motion abnormalities. Left ventricular diastolic parameters were normal. 2. Right ventricular systolic function is normal. The right ventricular size is normal. There is normal pulmonary artery systolic pressure. 3. The mitral valve is normal in structure. Trivial mitral valve regurgitation. No evidence of mitral stenosis. 4. The aortic valve is tricuspid. Aortic valve regurgitation is not visualized. No aortic stenosis is present. 5. The inferior vena cava is normal in size with greater than 50% respiratory variability, suggesting right atrial pressure of 3 mmHg.  Assessment and Plan:  1. SVT (supraventricular tachycardia) (HCC)   2. Heart rate fast   3. Chest pain of uncertain etiology    1. SVT (supraventricular tachycardia) (HCC) States she has been since having  some elevated heart rates recently after taking azithromycin.  She states the palpitations seem to be less and less after finishing the course of azithromycin.  She is unsure if it is related to the azithromycin or stress.  She states she has significant anxiety and takes Valium.  She is transitioning from Valium to Ativan.  We discussed possible as needed low-dose Cardizem for a brief period.  She is afraid if she takes the low-dose Cardizem for palpitations as needed in addition to the Ativan her heart rate will become slow as it had before.  She was previously on a higher dose of Cardizem at 240 and it was eventually decreased to 120 mg due to bradycardia.  She sees Dr. Johney Frame for palpitations/SVT.  She last saw Dr. Johney Frame in March 2022.  Advised her to call when she starts the Ativan and if she continues with  palpitations we could discuss as needed low-dose Cardizem.  It may be more appropriate to discuss this with Dr. Johney Frame since she has a follow-up with Dr. Johney Frame on November 14, 2020.  2. Heart rate fast EKG today shows normal sinus rhythm with a rate of 76.  Continue Cardizem 120 mg p.o. daily.  Patient believes part of her issue with fast heart rate is related to her anxiety.  She has been on long-term benzodiazepine therapy and has been transitioned recently from Valium to Ativan.  She states she has not started the Ativan yet yet due to concern over bradycardia if starting Ativan too soon.  3. Chest pain of uncertain etiology She denies any classic anginal symptoms.  No exertional chest pain or associated radiation or nausea, vomiting, diaphoresis.  She thinks it may be due to stress due to the fact she is living with her grandfather who has multiple medical issues and she is the primary caregiver.  Medication Adjustments/Labs and Tests Ordered: Current medicines are reviewed at length with the patient today.  Concerns regarding medicines are outlined above.   Disposition: Follow-up with Dr. Johney Frame at scheduled visit on September 19 at 1130.  Signed, Rennis Harding, NP 10/02/2020 10:43 PM    [facility] at [facility] [address] [place], [place], [place] [address] Phone: [phone]; Fax: [phone]

## 2020-10-03 ENCOUNTER — Ambulatory Visit (INDEPENDENT_AMBULATORY_CARE_PROVIDER_SITE_OTHER): Payer: Medicaid Other | Admitting: Family Medicine

## 2020-10-03 ENCOUNTER — Encounter: Payer: Self-pay | Admitting: Family Medicine

## 2020-10-03 VITALS — BP 110/70 | HR 76 | Ht 60.0 in | Wt 155.6 lb

## 2020-10-03 DIAGNOSIS — R000 Tachycardia, unspecified: Secondary | ICD-10-CM

## 2020-10-03 DIAGNOSIS — R079 Chest pain, unspecified: Secondary | ICD-10-CM

## 2020-10-03 DIAGNOSIS — I471 Supraventricular tachycardia: Secondary | ICD-10-CM | POA: Diagnosis not present

## 2020-10-03 NOTE — Patient Instructions (Addendum)
Medication Instructions:  Your physician recommends that you continue on your current medications as directed. Please refer to the Current Medication list given to you today.  Labwork: none  Testing/Procedures: none  Follow-Up: Your physician recommends that you schedule a follow-up appointment in: September with Dr. Johney Frame as planned.  Any Other Special Instructions Will Be Listed Below (If Applicable).  If you need a refill on your cardiac medications before your next appointment, please call your pharmacy.

## 2020-10-04 ENCOUNTER — Other Ambulatory Visit: Payer: Self-pay | Admitting: *Deleted

## 2020-10-04 ENCOUNTER — Ambulatory Visit: Payer: Medicaid Other | Admitting: Family Medicine

## 2020-10-04 DIAGNOSIS — Z131 Encounter for screening for diabetes mellitus: Secondary | ICD-10-CM

## 2020-10-05 ENCOUNTER — Telehealth: Payer: Self-pay | Admitting: Family Medicine

## 2020-10-05 NOTE — Telephone Encounter (Signed)
Patient called stating that around 300am she had an SVT attack. Called EMS by the time they got there she states that her HR was back to 100 when EMS left.  Patient has a question about the Cardizem.  [phone].

## 2020-10-05 NOTE — Telephone Encounter (Signed)
Pt states that she has been having some extra stress and anxiety. She repots that last night she felt and "SVT attack". Pt reports calling the EMS out and states that she was able to lower her heart rate by the time they got there. She reports that this episode lasted 20 to 30 mins. She denies chest pain and SOB during this episode. Pt states that her cardizem was decrease from 240 mg to 120 mg. Pt said that it was mentioned to her that she may need short acting cardizem. Please advise.

## 2020-10-05 NOTE — Telephone Encounter (Signed)
Office visit with MD or APP, first available per MD Allred.  Visit made with Otilio Saber, PA next week. ED precautions given. Patient aware and in agreement.

## 2020-10-09 NOTE — Progress Notes (Signed)
PCP:  Gwendolyn Chimera, MD Primary Cardiologist: None Electrophysiologist: Gwendolyn Range, MD   Gwendolyn [name] is a 40 y.o. female seen today for Gwendolyn Range, MD for acute visit due to worsening SVT .  Since last being seen in our clinic the patient reports doing poorly. She has had increasingly worse episodes of SVT. States she called EMS since seeing Gwendolyn [name] last week for HRs in the 260s at home. Her anxiety levels have been very high and was also recently treated for sinusitis. Has had intermittent leg swelling (negative D-dimer in July). She is concerned over bradycardia in the past (reported as low as the 50s). No syncope.  Her symptoms are also sometimes resolved by a valium.  Past Medical History:  Diagnosis Date   Bipolar disorder (HCC)    Depression    GERD (gastroesophageal reflux disease)    Migraines    Palpitations    SVT (supraventricular tachycardia) (HCC)    short RP SVT documented   Past Surgical History:  Procedure Laterality Date   ABDOMINAL HYSTERECTOMY     CHOLECYSTECTOMY     TUBAL LIGATION      Current Outpatient Medications  Medication Sig Dispense Refill   buPROPion (WELLBUTRIN XL) 150 MG 24 hr tablet Take 150 mg by mouth daily.     diazepam (VALIUM) 10 MG tablet Take 10 mg by mouth 3 (three) times daily.     diazepam (VALIUM) 10 MG tablet Take 10 mg by mouth as needed.     diltiazem (CARDIZEM CD) 120 MG 24 hr capsule Take 120 mg by mouth daily.     diltiazem (TIAZAC) 120 MG 24 hr capsule Take 120 mg by mouth daily.     EMGALITY 120 MG/ML SOAJ Inject 1 mL into the skin every 30 (thirty) days.     ibuprofen (ADVIL) 800 MG tablet Take 800 mg by mouth 3 (three) times daily.     omeprazole (PRILOSEC) 40 MG capsule Take 40 mg by mouth as needed.     polyethylene glycol powder (GLYCOLAX/MIRALAX) 17 GM/SCOOP powder Take by mouth.     No current facility-administered medications for this visit.    Allergies  Allergen Reactions   Amoxicillin Shortness Of  Breath, Swelling and Rash    Has patient had a PCN reaction causing immediate rash, facial/tongue/throat swelling, SOB or lightheadedness with hypotension: yes Has patient had a PCN reaction causing severe rash involving mucus membranes or skin necrosis: no Has patient had a PCN reaction that required hospitalization no Has patient had a PCN reaction occurring within the last 10 years: yes If all of the above answers are "NO", then may proceed with Cephalosporin use.   Azithromycin    Keflex [Cephalexin]     "I don't feel normal"    Social History   Socioeconomic History   Marital status: Married    Spouse name: Not on file   Number of children: Not on file   Years of education: Not on file   Highest education level: Not on file  Occupational History   Occupation: Disabled  Tobacco Use   Smoking status: Every Day    Years: 10.00    Types: Cigarettes    Last attempt to quit: 05/24/2020    Years since quitting: 0.3   Smokeless tobacco: Never  Vaping Use   Vaping Use: Never used  Substance and Sexual Activity   Alcohol use: Not Currently    Alcohol/week: 0.0 standard drinks   Drug use: Not Currently  Types: Marijuana    Comment: previous history of marijuana use   Sexual activity: Yes    Birth control/protection: Surgical    Comment: hyst  Other Topics Concern   Not on file  Social History Narrative   Married   No regular exercise   Social Determinants of Health   Financial Resource Strain: Not on file  Food Insecurity: Not on file  Transportation Needs: Not on file  Physical Activity: Not on file  Stress: Not on file  Social Connections: Not on file  Intimate Partner Violence: Not on file     Review of Systems: All other systems reviewed and are otherwise negative except as noted above.  Physical Exam: Vitals:   10/11/20 1022  BP: 98/68  Pulse: 74  SpO2: 98%  Weight: 156 lb 12.8 oz (71.1 kg)  Height: 5' (1.524 m)    GEN- The patient is well  appearing, alert and oriented x 3 today.   HEENT: normocephalic, atraumatic; sclera clear, conjunctiva pink; hearing intact; oropharynx clear; neck supple, no JVP Lymph- no cervical lymphadenopathy Lungs- Clear to ausculation bilaterally, normal work of breathing.  No wheezes, rales, rhonchi Heart- Regular rate and rhythm, no murmurs, rubs or gallops, PMI not laterally displaced GI- soft, non-tender, non-distended, bowel sounds present, no hepatosplenomegaly Extremities- no clubbing, cyanosis, or edema; DP/PT/radial pulses 2+ bilaterally MS- no significant deformity or atrophy Skin- warm and dry, no rash or lesion Psych- Anxious mood, full affect Neuro- strength and sensation are intact  EKG is ordered. Personal review of EKG from today shows NSR at 74 bpm  Additional studies reviewed include: Previous EP office notes.   Assessment and Plan:  1. SVT With worsening over the past couple of months and in the setting of recent sinusitis.  She is now willing to consider Ablation and wishes to see Dr. Johney [name] to discuss.  In the interim, will increase Diltiazem to 180 mg daily (Previous 240 was stopped due to HRs in 50s) Will also give diltiazem IR 30 mg prn to take for breakthrough Labs today to look at other background causes of exacerbation.  2. Anxiety Likely contributing.  Takes valium with usual good results  Gwendolyn Freer, PA-C  10/11/20 10:56 AM

## 2020-10-11 ENCOUNTER — Other Ambulatory Visit: Payer: Self-pay

## 2020-10-11 ENCOUNTER — Ambulatory Visit: Payer: Medicaid Other | Admitting: Student

## 2020-10-11 ENCOUNTER — Encounter: Payer: Self-pay | Admitting: Student

## 2020-10-11 VITALS — BP 98/68 | HR 74 | Ht 60.0 in | Wt 156.8 lb

## 2020-10-11 DIAGNOSIS — R002 Palpitations: Secondary | ICD-10-CM | POA: Diagnosis not present

## 2020-10-11 DIAGNOSIS — F419 Anxiety disorder, unspecified: Secondary | ICD-10-CM

## 2020-10-11 DIAGNOSIS — I471 Supraventricular tachycardia: Secondary | ICD-10-CM

## 2020-10-11 MED ORDER — DILTIAZEM HCL ER COATED BEADS 180 MG PO CP24: 180.0000 mg | ORAL_CAPSULE | Freq: Every day | ORAL | 3 refills | Status: DC

## 2020-10-11 MED ORDER — DILTIAZEM HCL 30 MG PO TABS: 30.0000 mg | ORAL_TABLET | Freq: Every day | ORAL | 3 refills | Status: DC | PRN

## 2020-10-11 NOTE — Patient Instructions (Signed)
Medication Instructions:  Your physician has recommended you make the following change in your medication:   INCREASE: Diltiazem to 180mg  daily START: Diltiazem 30mg  as needed   *If you need a refill on your cardiac medications before your next appointment, please call your pharmacy*   Lab Work: TODAY: CMET, CBC, TSH  If you have labs (blood work) drawn today and your tests are completely normal, you will receive your results only by: MyChart Message (if you have MyChart) OR A paper copy in the mail If you have any lab test that is abnormal or we need to change your treatment, we will call you to review the results.   Follow-Up: At [facility], you and your health needs are our priority.  As part of our continuing mission to provide you with exceptional heart care, we have created designated Provider Care Teams.  These Care Teams include your primary Cardiologist (physician) and Advanced Practice Providers (APPs -  Physician Assistants and Nurse Practitioners) who all work together to provide you with the care you need, when you need it.   Your next appointment:   As Scheduled

## 2020-10-12 ENCOUNTER — Telehealth: Payer: Self-pay | Admitting: Internal Medicine

## 2020-10-12 LAB — CBC
Hematocrit: 43.5 % (ref 34.0–46.6)
Hemoglobin: 14 g/dL (ref 11.1–15.9)
MCH: 26.2 pg — ABNORMAL LOW (ref 26.6–33.0)
MCHC: 32.2 g/dL (ref 31.5–35.7)
MCV: 81 fL (ref 79–97)
Platelets: 244 10*3/uL (ref 150–450)
RBC: 5.35 x10E6/uL — ABNORMAL HIGH (ref 3.77–5.28)
RDW: 13.3 % (ref 11.7–15.4)
WBC: 4.4 10*3/uL (ref 3.4–10.8)

## 2020-10-12 LAB — COMPREHENSIVE METABOLIC PANEL
ALT: 13 IU/L (ref 0–32)
AST: 18 IU/L (ref 0–40)
Albumin/Globulin Ratio: 2.4 — ABNORMAL HIGH (ref 1.2–2.2)
Albumin: 4.7 g/dL (ref 3.8–4.8)
Alkaline Phosphatase: 67 IU/L (ref 44–121)
BUN/Creatinine Ratio: 14 (ref 9–23)
BUN: 13 mg/dL (ref 6–24)
Bilirubin Total: 0.3 mg/dL (ref 0.0–1.2)
CO2: 20 mmol/L (ref 20–29)
Calcium: 9.7 mg/dL (ref 8.7–10.2)
Chloride: 105 mmol/L (ref 96–106)
Creatinine, Ser: 0.93 mg/dL (ref 0.57–1.00)
Globulin, Total: 2 g/dL (ref 1.5–4.5)
Glucose: 83 mg/dL (ref 65–99)
Potassium: 4.2 mmol/L (ref 3.5–5.2)
Sodium: 141 mmol/L (ref 134–144)
Total Protein: 6.7 g/dL (ref 6.0–8.5)
eGFR: 79 mL/min/{1.73_m2} (ref >59)

## 2020-10-12 LAB — TSH: TSH: 3.09 u[IU]/mL (ref 0.450–4.500)

## 2020-10-12 NOTE — Telephone Encounter (Signed)
New message:     Patient calling to get results. 

## 2020-10-14 NOTE — Telephone Encounter (Signed)
Returned pt's call. Went over lab results.

## 2020-11-14 ENCOUNTER — Ambulatory Visit (INDEPENDENT_AMBULATORY_CARE_PROVIDER_SITE_OTHER): Payer: Medicaid Other | Admitting: Internal Medicine

## 2020-11-14 ENCOUNTER — Other Ambulatory Visit: Payer: Self-pay

## 2020-11-14 VITALS — BP 100/66 | HR 110 | Ht 60.0 in | Wt 159.0 lb

## 2020-11-14 DIAGNOSIS — I471 Supraventricular tachycardia: Secondary | ICD-10-CM | POA: Diagnosis not present

## 2020-11-14 DIAGNOSIS — Z720 Tobacco use: Secondary | ICD-10-CM

## 2020-11-14 DIAGNOSIS — R000 Tachycardia, unspecified: Secondary | ICD-10-CM

## 2020-11-14 DIAGNOSIS — R001 Bradycardia, unspecified: Secondary | ICD-10-CM

## 2020-11-14 NOTE — Progress Notes (Signed)
PCP: Richardean Chimera, MD Primary Cardiologist: Dr Diona Browner Primary EP: Dr Langley Gwendolyn [name] is a 40 y.o. female who presents today for routine electrophysiology followup.  Since last being seen in our clinic, the patient reports doing reasonably well.  She has a fair bit of sinus tachycardia.  She also has abrupt onset/ offset episodes of SVT.  She noticed that her sinus tachycardia worsened with resumption of tobacco recently.  Today, she denies symptoms of exertional chest pain, shortness of breath,  lower extremity edema, dizziness, presyncope, or syncope.  The patient is otherwise without complaint today.   Past Medical History:  Diagnosis Date   Bipolar disorder (HCC)    Depression    GERD (gastroesophageal reflux disease)    Migraines    Palpitations    SVT (supraventricular tachycardia) (HCC)    short RP SVT documented   Past Surgical History:  Procedure Laterality Date   ABDOMINAL HYSTERECTOMY     CHOLECYSTECTOMY     TUBAL LIGATION      ROS- all systems are reviewed and negatives except as per HPI above  Current Outpatient Medications  Medication Sig Dispense Refill   buPROPion (WELLBUTRIN XL) 150 MG 24 hr tablet Take 150 mg by mouth daily.     diazepam (VALIUM) 10 MG tablet Take 10 mg by mouth 3 (three) times daily.     diazepam (VALIUM) 10 MG tablet Take 10 mg by mouth as needed.     diltiazem (CARDIZEM CD) 180 MG 24 hr capsule Take 1 capsule (180 mg total) by mouth daily. 90 capsule 3   diltiazem (CARDIZEM) 30 MG tablet Take 1 tablet (30 mg total) by mouth daily as needed. 60 tablet 3   EMGALITY 120 MG/ML SOAJ Inject 1 mL into the skin every 30 (thirty) days.     ibuprofen (ADVIL) 800 MG tablet Take 800 mg by mouth 3 (three) times daily.     omeprazole (PRILOSEC) 40 MG capsule Take 40 mg by mouth as needed.     polyethylene glycol powder (GLYCOLAX/MIRALAX) 17 GM/SCOOP powder Take by mouth.     No current facility-administered medications for this visit.     Physical Exam: Vitals:   11/14/20 1135  BP: 100/66  Pulse: (!) 110  SpO2: 97%  Weight: 159 lb (72.1 kg)  Height: 5' (1.524 m)    GEN- The patient is well appearing, alert and oriented x 3 today.   Head- normocephalic, atraumatic Eyes-  Sclera clear, conjunctiva pink Ears- hearing intact Oropharynx- clear Lungs- Clear to ausculation bilaterally, normal work of breathing Heart- Regular rate and rhythm, no murmurs, rubs or gallops, PMI not laterally displaced GI- soft, NT, ND, + BS Extremities- no clubbing, cyanosis, or edema  Wt Readings from Last 3 Encounters:  11/14/20 159 lb (72.1 kg)  10/11/20 156 lb 12.8 oz (71.1 kg)  10/03/20 155 lb 9.6 oz (70.6 kg)    EKG tracing ordered today is personally reviewed and shows sinus tachycardia  Assessment and Plan:  SVT Previously documented to be short RP SVT (available to review in Media section of epic from 03/13/19). Therapeutic strategies for supraventricular tachycardia including medicine and ablation were discussed in detail with the patient today. Risk, benefits, and alternatives to EP study and radiofrequency ablation were also discussed in detail today. These risks include but are not limited to stroke, bleeding, vascular damage, tamponade, perforation, damage to the heart and other structures, AV block requiring pacemaker, worsening renal function, and death. The patient understands  these risk and wishes to avoid ablation currently.  She may eventually decide to proceed. Continue diltiazem for now.  2. Sinus tachycardia Also bothersome to her.  Worsened since she started smoking again. Labs 8/22 reviewed Regular exercise, hydration, and smoking cessation advised  3. Tobacco Cessation advised  Return in 4 months.  She lives near [facility] and would like to follow-up with Dr Elberta Fortis there at that time.  [name] MD, [facility] 11/14/2020 11:44 AM

## 2020-11-14 NOTE — Patient Instructions (Signed)
Medication Instructions:  Your physician recommends that you continue on your current medications as directed. Please refer to the Current Medication list given to you today.  Labwork: None ordered.  Testing/Procedures: None ordered.  Follow-Up: Your physician wants you to follow-up in: 4 months with Dr. Elberta Fortis in [facility].    Any Other Special Instructions Will Be Listed Below (If Applicable).  If you need a refill on your cardiac medications before your next appointment, please call your pharmacy.   Please Hydrate and get regular exercise

## 2021-03-20 ENCOUNTER — Encounter: Payer: Self-pay | Admitting: Cardiology

## 2021-03-20 ENCOUNTER — Ambulatory Visit: Payer: Self-pay | Admitting: Cardiology

## 2021-03-20 ENCOUNTER — Ambulatory Visit (INDEPENDENT_AMBULATORY_CARE_PROVIDER_SITE_OTHER): Payer: Self-pay | Admitting: Cardiology

## 2021-03-20 ENCOUNTER — Other Ambulatory Visit: Payer: Self-pay

## 2021-03-20 ENCOUNTER — Ambulatory Visit (INDEPENDENT_AMBULATORY_CARE_PROVIDER_SITE_OTHER): Payer: Medicaid Other

## 2021-03-20 VITALS — BP 106/64 | HR 86 | Ht 60.0 in | Wt 160.1 lb

## 2021-03-20 DIAGNOSIS — I471 Supraventricular tachycardia: Secondary | ICD-10-CM

## 2021-03-20 DIAGNOSIS — R002 Palpitations: Secondary | ICD-10-CM

## 2021-03-20 MED ORDER — DILTIAZEM HCL ER COATED BEADS 180 MG PO CP24: 180.0000 mg | ORAL_CAPSULE | Freq: Every day | ORAL | 6 refills | Status: DC

## 2021-03-20 MED ORDER — DILTIAZEM HCL ER COATED BEADS 240 MG PO CP24: 240.0000 mg | ORAL_CAPSULE | Freq: Every day | ORAL | 3 refills | Status: DC

## 2021-03-20 NOTE — Progress Notes (Unsigned)
Enrolled for [employer] to mail a ZIO XT long term holter monitor to the patients address on file.  

## 2021-03-20 NOTE — Progress Notes (Signed)
Electrophysiology Office Note   Date:  03/20/2021   ID:  Gwendolyn [name], DOB 1980-10-08, MRN [id number]  PCP:  Gwendolyn Chimera, MD  Cardiologist:  Gwendolyn [name] Primary Electrophysiologist:  Gwendolyn Truss Jorja Loa, MD    Chief Complaint: SVT   History of Present Illness: Gwendolyn [name] is a 41 y.o. female who is being seen today for the evaluation of SVT at the request of Gwendolyn Chimera, MD. Presenting today for electrophysiology evaluation.  She has a history of bipolar disorder, migraines, SVT.  She also has sinus tachycardia.  When she last saw Gwendolyn [name], her sinus tachycardia increased with resumption of tobacco products.  She does have SVT with abrupt onset/offset episodes.  Today, she denies symptoms of chest pain, shortness of breath, orthopnea, PND, lower extremity edema, claudication, dizziness, presyncope, syncope, bleeding, or neurologic sequela. The patient is tolerating medications without difficulties.  He is continue to have palpitations.  Her palpitations are better with 180 mg of diltiazem, though she has continued to have issues.  She is unclear as to whether or not this is sinus tachycardia or SVT.  Her palpitations occur at anytime of the day without warning.   Past Medical History:  Diagnosis Date   Bipolar disorder (HCC)    Depression    GERD (gastroesophageal reflux disease)    Migraines    Palpitations    SVT (supraventricular tachycardia) (HCC)    short RP SVT documented   Past Surgical History:  Procedure Laterality Date   ABDOMINAL HYSTERECTOMY     CHOLECYSTECTOMY     TUBAL LIGATION       Current Outpatient Medications  Medication Sig Dispense Refill   diltiazem (CARDIZEM CD) 180 MG 24 hr capsule Take 1 capsule (180 mg total) by mouth daily. 30 capsule 6   buPROPion (WELLBUTRIN XL) 150 MG 24 hr tablet Take 150 mg by mouth daily.     diazepam (VALIUM) 10 MG tablet Take 10 mg by mouth 3 (three) times daily.     diazepam (VALIUM) 10 MG tablet Take 10 mg by  mouth as needed.     diltiazem (CARDIZEM) 30 MG tablet Take 1 tablet (30 mg total) by mouth daily as needed. 60 tablet 3   EMGALITY 120 MG/ML SOAJ Inject 1 mL into the skin every 30 (thirty) days.     ibuprofen (ADVIL) 800 MG tablet Take 800 mg by mouth 3 (three) times daily.     omeprazole (PRILOSEC) 40 MG capsule Take 40 mg by mouth as needed.     polyethylene glycol powder (GLYCOLAX/MIRALAX) 17 GM/SCOOP powder Take by mouth.     No current facility-administered medications for this visit.    Allergies:   Amoxicillin, Azithromycin, and Keflex [cephalexin]   Social History:  The patient  reports that she has been smoking cigarettes. She has never used smokeless tobacco. She reports that she does not currently use alcohol. She reports that she does not currently use drugs after having used the following drugs: Marijuana.   Family History:  [name] family history includes ADD / ADHD in her daughter, son, and son; Esophageal cancer in her maternal uncle; Heart disease in her father and another family member; Liver disease in her maternal grandfather; Other in her son.    ROS:  Please see the history of present illness.   Otherwise, review of systems is positive for none.   All other systems are reviewed and negative.    PHYSICAL EXAM: VS:  BP 106/64  Pulse 86    Ht 5' (1.524 m)    Wt 160 lb 1.9 oz (72.6 kg)    LMP  (LMP Unknown)    SpO2 96%    BMI 31.27 kg/m  , BMI Body mass index is 31.27 kg/m. GEN: Well nourished, well developed, in no acute distress  HEENT: normal  Neck: no JVD, carotid bruits, or masses Cardiac: RRR; no murmurs, rubs, or gallops,no edema  Respiratory:  clear to auscultation bilaterally, normal work of breathing GI: soft, nontender, nondistended, + BS MS: no deformity or atrophy  Skin: warm and dry Neuro:  Strength and sensation are intact Psych: euthymic mood, full affect  EKG:  EKG is not ordered today. Personal review of the ekg ordered 11/14/20 shows  sinus rhythm  Recent Labs: 10/11/2020: ALT 13; BUN 13; Creatinine, Ser 0.93; Hemoglobin 14.0; Platelets 244; Potassium 4.2; Sodium 141; TSH 3.090    Lipid Panel  No results found for: CHOL, TRIG, HDL, CHOLHDL, VLDL, LDLCALC, LDLDIRECT   Wt Readings from Last 3 Encounters:  03/20/21 160 lb 1.9 oz (72.6 kg)  11/14/20 159 lb (72.1 kg)  10/11/20 156 lb 12.8 oz (71.1 kg)      Other studies Reviewed: Additional studies/ records that were reviewed today include: TTE 07/12/20  Review of the above records today demonstrates:   1. Left ventricular ejection fraction, by estimation, is 55 to 60%. The  left ventricle has normal function. The left ventricle has no regional  wall motion abnormalities. Left ventricular diastolic parameters were  normal.   2. Right ventricular systolic function is normal. The right ventricular  size is normal. There is normal pulmonary artery systolic pressure.   3. The mitral valve is normal in structure. Trivial mitral valve  regurgitation. No evidence of mitral stenosis.   4. The aortic valve is tricuspid. Aortic valve regurgitation is not  visualized. No aortic stenosis is present.   5. The inferior vena cava is normal in size with greater than 50%  respiratory variability, suggesting right atrial pressure of 3 mmHg.    ASSESSMENT AND PLAN:  1.  SVT: Previously documented a short RP tachycardia.  Available in media from 03/13/2019.  She has been offered ablation previously but had wished to hold off.  She has continued to have palpitations.  Is difficult to say if this is SVT versus sinus tachycardia.  We Gwendolyn [name] have her wear a 2-week monitor for further determination of her symptoms.  If it is SVT, she would like to potentially avoid procedures.  Would increase diltiazem to 240 mg.  2.  Sinus tachycardia: Worsened since starting tobacco.  Encourage regular exercise, hydration, smoking cessation.  3.  Tobacco abuse: Complete cessation advised  Current  medicines are reviewed at length with the patient today.   The patient does not have concerns regarding her medicines.  The following changes were made today:  none  Labs/ tests ordered today include:  Orders Placed This Encounter  Procedures   LONG TERM MONITOR (3-14 DAYS)     Disposition:   FU with Landis Dowdy 3 months  Signed, Kaelee Pfeffer Jorja Loa, MD  03/20/2021 3:42 PM     [facility] [address] [place] [address] [phone] (office) [phone] (fax)

## 2021-03-20 NOTE — Patient Instructions (Addendum)
Medication Instructions:  Your physician recommends that you continue on your current medications as directed. Please refer to the Current Medication list given to you today.  *If you need a refill on your cardiac medications before your next appointment, please call your pharmacy*   Lab Work: None ordered   Testing/Procedures:                           Your physician has recommended that you wear a 14 day(s)/ week(s) heart monitor (ZIO patch) - This will be mailed directly to your home address/ placed in office today - You may receive a call directly from the company for [place], which is [employer] within the next few day- if you see an 800# or 224# calling, please answer - Once the monitor is applied and activated it will record your heart rate/ rhythm for the duration that you are wearing it - If you are having any symptoms (dizziness/ lightheadedness/ palpitations/ racing heart/ anything that just doesn't feel right) then you will push the button in the center of the monitor to mark you were having a symptom - Please DO NOT shower for 24 hours after the monitor is placed - No tub baths/ swimming pools/ hot tubs while wearing the monitor - Do not put any lotions, oils, or ointments around the monitor - If you have any issues with the monitor itself, then please call the 800 # for the company - After 14 day(s)/ week(s) please take the monitor off at home and mail it back ([place] mail) in the box provided by [employer], postage is already paid.    Follow-Up: At [facility], you and your health needs are our priority.  As part of our continuing mission to provide you with exceptional heart care, we have created designated Provider Care Teams.  These Care Teams include your primary Cardiologist (physician) and Advanced Practice Providers (APPs -  Physician Assistants and Nurse Practitioners) who all work together to provide you with the care you need, when you need it.  Your next appointment:   3  month(s)  The format for your next appointment:   In Person  Provider:   Allegra Lai, MD    Thank you for choosing [facility]!!   Trinidad Curet, RN [phone]   Other Instructions  ZIO XT- Long Term Monitor Instructions  Your physician has requested you wear a ZIO patch monitor for 14 days.  This is a single patch monitor. [employer] supplies one patch monitor per enrollment. Additional stickers are not available. Please do not apply patch if you will be having a Nuclear Stress Test,  Echocardiogram, Cardiac CT, MRI, or Chest Xray during the period you would be wearing the  monitor. The patch cannot be worn during these tests. You cannot remove and re-apply the  ZIO XT patch monitor.  Your ZIO patch monitor will be mailed 3 day USPS to your address on file. It may take 3-5 days  to receive your monitor after you have been enrolled.  Once you have received your monitor, please review the enclosed instructions. Your monitor  has already been registered assigning a specific monitor serial # to you.  Billing and Patient Assistance Program Information  We have supplied [employer] with any of your insurance information on file for billing purposes. [employer] offers a sliding scale Patient Assistance Program for patients that do not have  insurance, or whose insurance does not completely cover the cost of  the ZIO monitor.  You must apply for the Patient Assistance Program to qualify for this discounted rate.  To apply, please call [employer] at [phone], select option 4, select option 2, ask to apply for  Patient Assistance Program. Theodore Demark will ask your household income, and how many people  are in your household. They will quote your out-of-pocket cost based on that information.  [employer] will also be able to set up a 23-month, interest-free payment plan if needed.  Applying the monitor   Shave hair from upper left chest.  Hold abrader disc by orange tab. Rub abrader in 40  strokes over the upper left chest as  indicated in your monitor instructions.  Clean area with 4 enclosed alcohol pads. Let dry.  Apply patch as indicated in monitor instructions. Patch will be placed under collarbone on left  side of chest with arrow pointing upward.  Rub patch adhesive wings for 2 minutes. Remove white label marked "1". Remove the white  label marked "2". Rub patch adhesive wings for 2 additional minutes.  While looking in a mirror, press and release button in center of patch. A small green light will  flash 3-4 times. This will be your only indicator that the monitor has been turned on.  Do not shower for the first 24 hours. You may shower after the first 24 hours.  Press the button if you feel a symptom. You will hear a small click. Record Date, Time and  Symptom in the Patient Logbook.  When you are ready to remove the patch, follow instructions on the last 2 pages of Patient  Logbook. Stick patch monitor onto the last page of Patient Logbook.  Place Patient Logbook in the blue and white box. Use locking tab on box and tape box closed  securely. The blue and white box has prepaid postage on it. Please place it in the mailbox as  soon as possible. Your physician should have your test results approximately 7 days after the  monitor has been mailed back to [facility].  Call [place] at [phone] if you have questions regarding  your ZIO XT patch monitor. Call them immediately if you see an orange light blinking on your  monitor.  If your monitor falls off in less than 4 days, contact our Monitor department at [phone].  If your monitor becomes loose or falls off after 4 days call [employer] at [phone] for  suggestions on securing your monitor

## 2021-03-22 DIAGNOSIS — I471 Supraventricular tachycardia: Secondary | ICD-10-CM | POA: Diagnosis not present

## 2021-03-22 DIAGNOSIS — R002 Palpitations: Secondary | ICD-10-CM

## 2021-04-10 ENCOUNTER — Encounter: Payer: Self-pay | Admitting: Cardiology

## 2021-04-28 MED ORDER — METOPROLOL SUCCINATE ER 50 MG PO TB24: 50.0000 mg | ORAL_TABLET | Freq: Every day | ORAL | 3 refills | Status: DC

## 2021-04-28 NOTE — Telephone Encounter (Signed)
Per Dr. Curt Bears :: Advised to stop Diltiazem to see if cause of ankle edema. ?Pt to start Toprol 50 mg once daily. ?Advised to let us know is SE occur after starting. ?Patient verbalized understanding and agreeable to plan.  ? ?

## 2021-04-28 NOTE — Telephone Encounter (Signed)
Pt been undergoing a lot of stress at home recently, along with recent infection about the time of events sent in. ?Events have calmed down. ?She does have some swelling in her ankles and abdomen and wonders if she needs to take some Lasix. ?Pt aware that I am not sure she would need, but we might want to consider trying her on different medication to see if Diltiazem is culprit for swelling. ?Pt has taken Toprol many years ago and agreeable to trying again if MD recocommended. ?Aware will forward to MD for review/advisement and will contact her by sometime next week. ?Patient verbalized understanding and agreeable to plan.  ? ?

## 2021-05-08 ENCOUNTER — Telehealth: Payer: Self-pay | Admitting: Cardiology

## 2021-05-08 MED ORDER — METOPROLOL SUCCINATE ER 25 MG PO TB24: 25.0000 mg | ORAL_TABLET | Freq: Every day | ORAL | 3 refills | Status: DC

## 2021-05-08 NOTE — Telephone Encounter (Signed)
Called and made patient aware that Dr. Elberta Fortis agreed with the recommendation to decrease Toprol to 25 mg QHS. Rx updated at patient's preferred pharmacy. Patient will call the pharmacy when refills needed. Instructed the patient to continue to monitor BP and HR and let us know if her Sx do not improve. Patient verbalized understanding and thanked me for the call. ?

## 2021-05-08 NOTE — Telephone Encounter (Signed)
STAT if HR is under 50 or over 120 ?(normal HR is 60-100 beats per minute) ? ?What is your heart rate? Not sure ? ?Do you have a log of your heart rate readings (document readings)? Was in low 50's at the hospital and BP was 96/50 ? ?Do you have any other symptoms? Very dizzy, bad headache, has not passed out ? ?Patient states she was seen in the ED for her low HR and needs to discuss it with a nurse. ? ? ?

## 2021-05-08 NOTE — Telephone Encounter (Signed)
Called and spoke to patient. She states that since stopping diltiazem and starting Toprol 50 mg QHS that she has had low HR and BP and has been dizzy and lightheaded. Denies syncope. She states HR has gotten as low as 46 bpm and BP has been around 96/50. Denies having any SVT since the med change. Patient states that she has been staying well hydrated. She states that when she was in the ED that they checked orthostatics that were normal. Vitals at discharge BP 99/61 and HR 56. Instructed the patient to decrease Toprol to 25 mg QHS and that I would forward to Dr. Elberta Fortis for further review and advisement. Patient verbalized understanding and thanked me for the call.  ? ? ? ? ?

## 2021-05-19 ENCOUNTER — Encounter: Payer: Self-pay | Admitting: Cardiology

## 2021-05-22 NOTE — Telephone Encounter (Signed)
Left message to call back  

## 2021-05-24 ENCOUNTER — Telehealth: Payer: Self-pay | Admitting: Cardiology

## 2021-05-24 NOTE — Telephone Encounter (Signed)
Spoke with patient, states she has been experiencing symptoms for the past month. ? ?Patient reports faint pulse to left carotid when sitting up (absent when lying down), left hand with pale fingers (purple moons in nailbed) with occasional tingling. Patient reports having cold hands/feet constantly.  ? ?Patient reports BP ranges 100/60-70's, HR 60 at rest (will jump to 80-100 when standing and walking from one room to another). She states "I just don't feel right." Patient reports fatigue x1 month. ? ?Patient denies chest pain, but report occasional chest tightness. She also reports occasional dizziness (this has improved after reducing metoprolol 05/08/21. ? ?Scheduled for DOD appointment with Dr. Clifton James 05/25/21 at 3:30PM. ED precautions given, patient verbalized understanding. ?

## 2021-05-24 NOTE — Telephone Encounter (Signed)
Patient states she is returning [place]. ?

## 2021-05-25 ENCOUNTER — Encounter: Payer: Self-pay | Admitting: Cardiovascular Disease

## 2021-05-25 ENCOUNTER — Ambulatory Visit (INDEPENDENT_AMBULATORY_CARE_PROVIDER_SITE_OTHER): Payer: Medicaid Other | Admitting: Cardiovascular Disease

## 2021-05-25 ENCOUNTER — Ambulatory Visit (INDEPENDENT_AMBULATORY_CARE_PROVIDER_SITE_OTHER): Payer: Medicaid Other

## 2021-05-25 VITALS — BP 112/70 | HR 57 | Ht 60.0 in | Wt 160.2 lb

## 2021-05-25 DIAGNOSIS — I471 Supraventricular tachycardia: Secondary | ICD-10-CM | POA: Diagnosis not present

## 2021-05-25 NOTE — Progress Notes (Signed)
? ?Chief Complaint  ?Patient presents with  ? Follow-up  ?  SVT  ? ?History of Present Illness: 41 yo female with history of bipolar disorder, GERD, migraines and SVT who is here today as an add on to my schedule for the evaluation of fatigue, weakness. She is followed in our office by Dr. Lennie Odor for SVT. She has been on Toprol and using Cardizem as needed for the past month. She tells me today that she has had no episodes of tachycardia. She called our office to report anxiety around her heart rate at night. She is fearful that her heart rate is too low when she is sleeping. She abruptly wakes up and feels dyspneic. She has been cautious to take her Valium as she is fearful that it may slow her heart down more. She also is worried that she cannot feel her left carotid pulse when she first wakes up. No dizziness, near syncope or syncope.  ? ?Primary Care Physician: Caryl Bis, MD ?Primary Cardiology: Dr. Lennie Odor ? ?Past Medical History:  ?Diagnosis Date  ? Bipolar disorder ([place])   ? Depression   ? GERD (gastroesophageal reflux disease)   ? Migraines   ? Palpitations   ? SVT (supraventricular tachycardia) ([place])   ? short RP SVT documented  ? ? ?Past Surgical History:  ?Procedure Laterality Date  ? ABDOMINAL HYSTERECTOMY    ? CHOLECYSTECTOMY    ? TUBAL LIGATION    ? ? ?Current Outpatient Medications  ?Medication Sig Dispense Refill  ? diazepam (VALIUM) 10 MG tablet Take 10 mg by mouth 3 (three) times daily.    ? EMGALITY 120 MG/ML SOAJ Inject 1 mL into the skin every 30 (thirty) days.    ? ibuprofen (ADVIL) 800 MG tablet Take 800 mg by mouth 3 (three) times daily.    ? metoprolol succinate (TOPROL-XL) 25 MG 24 hr tablet Take 1 tablet (25 mg total) by mouth daily. Take with or immediately following a meal. 90 tablet 3  ? omeprazole (PRILOSEC) 40 MG capsule Take 40 mg by mouth as needed.    ? polyethylene glycol powder (GLYCOLAX/MIRALAX) 17 GM/SCOOP powder Take by mouth.    ? ?No current facility-administered  medications for this visit.  ? ? ?Allergies  ?Allergen Reactions  ? Amoxicillin Shortness Of Breath, Swelling and Rash  ?  Has patient had a PCN reaction causing immediate rash, facial/tongue/throat swelling, SOB or lightheadedness with hypotension: yes ?Has patient had a PCN reaction causing severe rash involving mucus membranes or skin necrosis: no ?Has patient had a PCN reaction that required hospitalization no ?Has patient had a PCN reaction occurring within the last 10 years: yes ?If all of the above answers are "NO", then may proceed with Cephalosporin use.  ? Azithromycin   ? Keflex [Cephalexin]   ?  "I don't feel normal"  ? ? ?Social History  ? ?Socioeconomic History  ? Marital status: Married  ?  Spouse name: Not on file  ? Number of children: Not on file  ? Years of education: Not on file  ? Highest education level: Not on file  ?Occupational History  ? Occupation: Disabled  ?Tobacco Use  ? Smoking status: Every Day  ?  Years: 10.00  ?  Types: Cigarettes  ?  Last attempt to quit: 05/24/2020  ?  Years since quitting: 1.0  ? Smokeless tobacco: Never  ?Vaping Use  ? Vaping Use: Never used  ?Substance and Sexual Activity  ? Alcohol use: Not Currently  ?  Alcohol/week: 0.0 standard drinks  ? Drug use: Not Currently  ?  Types: Marijuana  ?  Comment: previous history of marijuana use  ? Sexual activity: Yes  ?  Birth control/protection: Surgical  ?  Comment: hyst  ?Other Topics Concern  ? Not on file  ?Social History Narrative  ? Married  ? No regular exercise  ? ?Social Determinants of Health  ? ?Financial Resource Strain: Not on file  ?Food Insecurity: Not on file  ?Transportation Needs: Not on file  ?Physical Activity: Not on file  ?Stress: Not on file  ?Social Connections: Not on file  ?Intimate Partner Violence: Not on file  ? ? ?Family History  ?Problem Relation Age of Onset  ? Heart disease Father   ? Heart disease Other   ? Liver disease Maternal Grandfather   ? Other Son   ?     fatty liver  ? ADD / ADHD  Son   ? ADD / ADHD Son   ? ADD / ADHD Daughter   ? Esophageal cancer Maternal Uncle   ? Colon cancer Neg Hx   ? Pancreatic cancer Neg Hx   ? Stomach cancer Neg Hx   ? ? ?Review of Systems:  As stated in the HPI and otherwise negative.  ? ?BP 112/70   Pulse (!) 57   Ht 5' (1.524 m)   Wt 160 lb 3.2 oz (72.7 kg)   LMP  (LMP Unknown)   SpO2 97%   BMI 31.29 kg/m?  ? ?Physical Examination: ?General: Well developed, well nourished, NAD  ?HEENT: OP clear, mucus membranes moist  ?SKIN: warm, dry. No rashes. ?Neuro: No focal deficits  ?Musculoskeletal: Muscle strength 5/5 all ext  ?Psychiatric: Mood and affect normal  ?Neck: No JVD, no carotid bruits, no thyromegaly, no lymphadenopathy.  ?Lungs:Clear bilaterally, no wheezes, rhonci, crackles ?Cardiovascular: Regular rate and rhythm. No murmurs, gallops or rubs. ?Abdomen:Soft. Bowel sounds present. Non-tender.  ?Extremities: No lower extremity edema. Pulses are 2 + in the bilateral DP/PT. ? ?EKG:  EKG is ordered today. ?The ekg ordered today demonstrates Sinus bradycardia, rate 57 bpm ? ?Recent Labs: ?10/11/2020: ALT 13; BUN 13; Creatinine, Ser 0.93; Hemoglobin 14.0; Platelets 244; Potassium 4.2; Sodium 141; TSH 3.090  ? ?Lipid Panel ?No results found for: CHOL, TRIG, HDL, CHOLHDL, VLDL, LDLCALC, LDLDIRECT ?  ?Wt Readings from Last 3 Encounters:  ?05/25/21 160 lb 3.2 oz (72.7 kg)  ?03/20/21 160 lb 1.9 oz (72.6 kg)  ?11/14/20 159 lb (72.1 kg)  ?  ? ?Assessment and Plan:  ? ?1. SVT: EKG with sinus bradycardia today. She is taking Toprol 25 mg per day. It seems to be working well. She is very anxious and worried that her heart rate is too slow at night. I think the only way to reassure her that her heart rate is ok at night is to have her wear a 3 day Zio cardiac monitor. We will arrange this. Continue Toprol 25 mg daily.  ? ?Labs/ tests ordered today include:  ? ?Orders Placed This Encounter  ?Procedures  ? LONG TERM MONITOR (3-14 DAYS)  ? EKG 12-Lead  ? ? ? ?Disposition:    F/U with Dr. Lennie Odor in several weeks.  ? ? ?Signed, ?Lauree Chandler, MD ?05/25/2021 4:29 PM    ?[facility] ?[address], [place], [place]  [address] ?Phone: [phone]; Fax: [phone]  ? ? ?

## 2021-05-25 NOTE — Patient Instructions (Signed)
Medication Instructions:  ?No changes ?*If you need a refill on your cardiac medications before your next appointment, please call your pharmacy* ? ? ?Lab Work: ?none ?If you have labs (blood work) drawn today and your tests are completely normal, you will receive your results only by: ?MyChart Message (if you have MyChart) OR ?A paper copy in the mail ?If you have any lab test that is abnormal or we need to change your treatment, we will call you to review the results. ? ? ?Testing/Procedures: ?Psychologist, clinical - 3 days ? ? ?Follow-Up: ?At [facility], you and your health needs are our priority.  As part of our continuing mission to provide you with exceptional heart care, we have created designated Provider Care Teams.  These Care Teams include your primary Cardiologist (physician) and Advanced Practice Providers (APPs -  Physician Assistants and Nurse Practitioners) who all work together to provide you with the care you need, when you need it. ? ?We recommend signing up for the patient portal called "MyChart".  Sign up information is provided on this After Visit Summary.  MyChart is used to connect with patients for Virtual Visits (Telemedicine).  Patients are able to view lab/test results, encounter notes, upcoming appointments, etc.  Non-urgent messages can be sent to your provider as well.   ?To learn more about what you can do with MyChart, go to [email or web address].   ? ?Your next appointment:   ?3-6 week(s) ? ?The format for your next appointment:   ?In Person ? ?Provider:   ?You may see Will Elberta Fortis, MD or one of the following Advanced Practice Providers on your designated Care Team:   ?Francis Dowse, PA-C ?Casimiro Needle "Otilio Saber, PA-C{ ? ? ?Other Instructions ?ZIO XT- Long Term Monitor Instructions ? ?Your physician has requested you wear a ZIO patch monitor for 3 days.  ?This is a single patch monitor. [employer] supplies one patch monitor per enrollment. Additional ?stickers are not available.  Please do not apply patch if you will be having a Nuclear Stress Test,  ?Echocardiogram, Cardiac CT, MRI, or Chest Xray during the period you would be wearing the  ?monitor. The patch cannot be worn during these tests. You cannot remove and re-apply the  ?ZIO XT patch monitor.  ?Your ZIO patch monitor will be mailed 3 day USPS to your address on file. It may take 3-5 days  ?to receive your monitor after you have been enrolled.  ?Once you have received your monitor, please review the enclosed instructions. Your monitor  ?has already been registered assigning a specific monitor serial # to you. ? ?Billing and Patient Assistance Program Information ? ?We have supplied [employer] with any of your insurance information on file for billing purposes. ?[employer] offers a sliding scale Patient Assistance Program for patients that do not have  ?insurance, or whose insurance does not completely cover the cost of the ZIO monitor.  ?You must apply for the Patient Assistance Program to qualify for this discounted rate.  ?To apply, please call [employer] at [phone], select option 4, select option 2, ask to apply for  ?Patient Assistance Program. Meredeth Ide will ask your household income, and how many people  ?are in your household. They will quote your out-of-pocket cost based on that information.  ?[employer] will also be able to set up a 33-month, interest-free payment plan if needed. ? ?Applying the monitor ?  ?Shave hair from upper left chest.  ?Hold abrader disc by orange tab. Rub abrader in 40 strokes  over the upper left chest as  ?indicated in your monitor instructions.  ?Clean area with 4 enclosed alcohol pads. Let dry.  ?Apply patch as indicated in monitor instructions. Patch will be placed under collarbone on left  ?side of chest with arrow pointing upward.  ?Rub patch adhesive wings for 2 minutes. Remove white label marked "1". Remove the white  ?label marked "2". Rub patch adhesive wings for 2 additional minutes.  ?While  looking in a mirror, press and release button in center of patch. A small green light will  ?flash 3-4 times. This will be your only indicator that the monitor has been turned on.  ?Do not shower for the first 24 hours. You may shower after the first 24 hours.  ?Press the button if you feel a symptom. You will hear a small click. Record Date, Time and  ?Symptom in the Patient Logbook.  ?When you are ready to remove the patch, follow instructions on the last 2 pages of Patient  ?Logbook. Stick patch monitor onto the last page of Patient Logbook.  ?Place Patient Logbook in the blue and white box. Use locking tab on box and tape box closed  ?securely. The blue and white box has prepaid postage on it. Please place it in the mailbox as  ?soon as possible. Your physician should have your test results approximately 7 days after the  ?monitor has been mailed back to [facility].  ?Call [facility] at [phone] if you have questions regarding  ?your ZIO XT patch monitor. Call them immediately if you see an orange light blinking on your  ?monitor.  ?If your monitor falls off in less than 4 days, contact our Monitor department at [phone].  ?If your monitor becomes loose or falls off after 4 days call [employer] at [phone] for  ?suggestions on securing your monitor ?  ?

## 2021-05-25 NOTE — Progress Notes (Unsigned)
Applied a 3 day Zio XT monitor to patient in the office 

## 2021-06-01 ENCOUNTER — Encounter: Payer: Self-pay | Admitting: Cardiology

## 2021-06-01 ENCOUNTER — Telehealth: Payer: Self-pay | Admitting: Physician Assistant

## 2021-06-01 NOTE — Telephone Encounter (Signed)
Patient states she was told to stop her medication, but she needs to take something and has not heard back yet.  ?

## 2021-06-01 NOTE — Telephone Encounter (Addendum)
? ?  The patient called the answering service after-hours today. ?Received notification that this was second call, previous call was paged out prior to myself coming on shift. ?There was a delayed callback due to hospital emergency happening when call came in. ?Please see patient's Mychart message that she sent to Dr. Curt Bears. This sums up the symptoms she is describing. I have asked her not to take another dose of metoprolol until she hears back from Dr. Curt Bears. Will cc to him for review. ? ? ?Charlie Pitter, PA-C ? ?

## 2021-06-01 NOTE — Telephone Encounter (Signed)
Pt reports "brain fog, fatigue/tiredness, insomnia and pruritis. ?She has been scratching so bad she has open sores from the scratching. ? ?Pt advised to stop Metoprolol now. ?She has Diltiazem 30 mg PRN and will use if needed in the meantime. ?Will forward to Dr. Curt Bears for review/advisement. ?Dr. Curt Bears she has tried Diltiazem but had swelling. ?

## 2021-06-01 NOTE — Telephone Encounter (Signed)
Contacted patient, advised that message was sent over to [facility] primary RN- as soon as she has an answer from MD we will contact her to discuss further.  ?Patient thankful for call back.  ? ?

## 2021-06-02 NOTE — Progress Notes (Signed)
? ?PCP:  Richardean Chimera, MD ?Primary Cardiologist: Will Jorja Loa, MD ?Electrophysiologist: Will Jorja Loa, MD  ? ?Gwendolyn [name] is a 41 y.o. female seen today for Will Jorja Loa, MD for routine electrophysiology followup.  Since last being seen in our clinic the patient reports doing poorly. She did not tolerate metoprolol with itching and insomnia. She has palpitations lasting 10 to 60 minutes, 2-3 times a week. She used her diltiazem Sunday, and again yesterday. She is very interested in ablation. She is also having issues with hormones and states there may eventually be plans to take out her ovaries.  ? ?Past Medical History:  ?Diagnosis Date  ? Bipolar disorder ([facility])   ? Depression   ? GERD (gastroesophageal reflux disease)   ? Migraines   ? Palpitations   ? SVT (supraventricular tachycardia) ([facility])   ? short RP SVT documented  ? ?Past Surgical History:  ?Procedure Laterality Date  ? ABDOMINAL HYSTERECTOMY    ? CHOLECYSTECTOMY    ? TUBAL LIGATION    ? ? ?Current Outpatient Medications  ?Medication Sig Dispense Refill  ? diazepam (VALIUM) 10 MG tablet Take 10 mg by mouth 3 (three) times daily.    ? ibuprofen (ADVIL) 800 MG tablet Take 800 mg by mouth 3 (three) times daily.    ? omeprazole (PRILOSEC) 40 MG capsule Take 40 mg by mouth as needed.    ? cetirizine (ZYRTEC) 10 MG tablet Take 10 mg by mouth daily.    ? EMGALITY 120 MG/ML SOAJ Inject 1 mL into the skin every 30 (thirty) days. (Patient not taking: Reported on 06/07/2021)    ? metoprolol succinate (TOPROL-XL) 25 MG 24 hr tablet Take 1 tablet (25 mg total) by mouth daily. Take with or immediately following a meal. (Patient not taking: Reported on 06/07/2021) 90 tablet 3  ? polyethylene glycol powder (GLYCOLAX/MIRALAX) 17 GM/SCOOP powder Take by mouth. (Patient not taking: Reported on 06/07/2021)    ? ?No current facility-administered medications for this visit.  ? ? ?Allergies  ?Allergen Reactions  ? Amoxicillin Shortness Of Breath, Swelling  and Rash  ?  Has patient had a PCN reaction causing immediate rash, facial/tongue/throat swelling, SOB or lightheadedness with hypotension: yes ?Has patient had a PCN reaction causing severe rash involving mucus membranes or skin necrosis: no ?Has patient had a PCN reaction that required hospitalization no ?Has patient had a PCN reaction occurring within the last 10 years: yes ?If all of the above answers are "NO", then may proceed with Cephalosporin use.  ? Toprol Xl [Metoprolol] Rash  ?  Also with insomnia. Symptoms improved off the metoprolol   ? Azithromycin   ? Keflex [Cephalexin]   ?  "I don't feel normal"  ? ? ?Social History  ? ?Socioeconomic History  ? Marital status: Married  ?  Spouse name: Not on file  ? Number of children: Not on file  ? Years of education: Not on file  ? Highest education level: Not on file  ?Occupational History  ? Occupation: Disabled  ?Tobacco Use  ? Smoking status: Every Day  ?  Years: 10.00  ?  Types: Cigarettes  ?  Last attempt to quit: 05/24/2020  ?  Years since quitting: 1.0  ? Smokeless tobacco: Never  ?Vaping Use  ? Vaping Use: Never used  ?Substance and Sexual Activity  ? Alcohol use: Not Currently  ?  Alcohol/week: 0.0 standard drinks  ? Drug use: Not Currently  ?  Types: Marijuana  ?  Comment: previous history of marijuana use  ? Sexual activity: Yes  ?  Birth control/protection: Surgical  ?  Comment: hyst  ?Other Topics Concern  ? Not on file  ?Social History Narrative  ? Married  ? No regular exercise  ? ?Social Determinants of Health  ? ?Financial Resource Strain: Not on file  ?Food Insecurity: Not on file  ?Transportation Needs: Not on file  ?Physical Activity: Not on file  ?Stress: Not on file  ?Social Connections: Not on file  ?Intimate Partner Violence: Not on file  ? ? ? ?Review of Systems: ?All other systems reviewed and are otherwise negative except as noted above. ? ?Physical Exam: ?Vitals:  ? 06/07/21 0940  ?BP: 122/70  ?Pulse: 94  ?SpO2: 99%  ?Weight: 162 lb  9.6 oz (73.8 kg)  ?Height: 5' (1.524 m)  ? ? ?GEN- The patient is well appearing, alert and oriented x 3 today.   ?HEENT: normocephalic, atraumatic; sclera clear, conjunctiva pink; hearing intact; oropharynx clear; neck supple, no JVP ?Lymph- no cervical lymphadenopathy ?Lungs- Clear to ausculation bilaterally, normal work of breathing.  No wheezes, rales, rhonchi ?Heart- Regular rate and rhythm, no murmurs, rubs or gallops, PMI not laterally displaced ?GI- soft, non-tender, non-distended, bowel sounds present, no hepatosplenomegaly ?Extremities- no clubbing, cyanosis, or edema; DP/PT/radial pulses 2+ bilaterally ?MS- no significant deformity or atrophy ?Skin- warm and dry, no rash or lesion ?Psych- euthymic mood, full affect ?Neuro- strength and sensation are intact ? ?EKG is not ordered.  ? ?Additional studies reviewed include: ?Previous EP office notes.  ? ?Assessment and Plan: ? ?1.  SVT:  ?Previously documented a short RP tachycardia.  Available in media from 03/13/2019.  She has been offered ablation previously but had wished to hold off.  She has continued to have palpitations.  Is difficult to say if this is SVT versus sinus tachycardia.   ?Additional monitor pending.  ?Continue diltiazem prn, refuses daily.  ?Will try bisoprolol 5 mg daily, if off target effects, will stop and use short acting diltiazem 2-3 times a day as prophylactic to bridge to EPS/ablation consideration.  ?  ?2.  Sinus tachycardia:  ?Worsened since starting tobacco.  Encourage regular exercise, hydration, smoking cessation. ?  ?3.  Tobacco abuse:  ?Complete cessation has been advised ? ?Follow up with Dr. Elberta Fortis as scheduled as she is very interested in ablation.  ? ?Graciella Freer, PA-C  ?06/07/21 ?10:03 AM  ?

## 2021-06-02 NOTE — Telephone Encounter (Signed)
Pt reports she is doing so much better since yesterday, after stopping Toprol, and she slept last night.   She is very pleased to have slept good finally. ?Itching also gone. ?Advised that we will address if a medication needs to be added to her treatment plan at next weeks appt. ?Aware to call if issues b/t now and then and reminded her she can take PRN Diltiazem if needed. ?Patient verbalized understanding and agreeable to plan.  ? ?

## 2021-06-07 ENCOUNTER — Encounter: Payer: Self-pay | Admitting: Student

## 2021-06-07 ENCOUNTER — Ambulatory Visit (INDEPENDENT_AMBULATORY_CARE_PROVIDER_SITE_OTHER): Payer: Medicaid Other | Admitting: Student

## 2021-06-07 ENCOUNTER — Telehealth: Payer: Self-pay

## 2021-06-07 VITALS — BP 122/70 | HR 94 | Ht 60.0 in | Wt 162.6 lb

## 2021-06-07 DIAGNOSIS — R002 Palpitations: Secondary | ICD-10-CM

## 2021-06-07 DIAGNOSIS — Z720 Tobacco use: Secondary | ICD-10-CM | POA: Diagnosis not present

## 2021-06-07 DIAGNOSIS — I471 Supraventricular tachycardia: Secondary | ICD-10-CM

## 2021-06-07 MED ORDER — BISOPROLOL FUMARATE 5 MG PO TABS: 5.0000 mg | ORAL_TABLET | Freq: Every day | ORAL | 3 refills | Status: DC

## 2021-06-07 NOTE — Patient Instructions (Signed)
Medication Instructions:  ?Your physician has recommended you make the following change in your medication:  ? ?START: Bisoprolol 5mg  daily ? ?*If you need a refill on your cardiac medications before your next appointment, please call your pharmacy* ? ? ?Lab Work: ?None ?If you have labs (blood work) drawn today and your tests are completely normal, you will receive your results only by: ?MyChart Message (if you have MyChart) OR ?A paper copy in the mail ?If you have any lab test that is abnormal or we need to change your treatment, we will call you to review the results. ? ? ?Follow-Up: ?At [facility], you and your health needs are our priority.  As part of our continuing mission to provide you with exceptional heart care, we have created designated Provider Care Teams.  These Care Teams include your primary Cardiologist (physician) and Advanced Practice Providers (APPs -  Physician Assistants and Nurse Practitioners) who all work together to provide you with the care you need, when you need it. ? ?Your next appointment:   ?Follow up as scheduled ? ?Important Information About Sugar ? ? ? ? ?  ?

## 2021-06-07 NOTE — Telephone Encounter (Signed)
P/A completed on CoverMyMeds for Bisoprolol 5mg  daily. ?Key: ?Response pending... ?

## 2021-06-08 ENCOUNTER — Other Ambulatory Visit: Payer: Self-pay

## 2021-06-08 MED ORDER — BISOPROLOL FUMARATE 5 MG PO TABS
5.0000 mg | ORAL_TABLET | Freq: Every day | ORAL | 2 refills | Status: DC
Start: 2021-06-08 — End: 2022-12-14

## 2021-06-08 NOTE — Telephone Encounter (Signed)
Bisoprolol P/A was denied. This medication is on the $9 [facility] list so I called the pt to see if she would be willing to pay out of pocket for it. She would like to try the short acting Diltiazem first to get her by until her appt with Dr Elberta Fortis on 4/24. Per Andy's note she can take 2-3 daily. I also sent in the Bisoprolol to [facility] incase she needed it since the Diltiazem caused swelling last time she tried it.  ?She is aware that she should only take one of them and not both. ?She will call with any concerns prior to her appt on 4/24. ?

## 2021-06-13 ENCOUNTER — Telehealth: Payer: Self-pay | Admitting: Cardiology

## 2021-06-13 NOTE — Telephone Encounter (Signed)
I spoke with patient and reviewed monitor results with her.  

## 2021-06-13 NOTE — Telephone Encounter (Signed)
Pt returning call about monitor results. Please advise ?

## 2021-06-19 ENCOUNTER — Encounter: Payer: Self-pay | Admitting: *Deleted

## 2021-06-19 ENCOUNTER — Ambulatory Visit: Payer: Self-pay | Admitting: Cardiology

## 2021-06-19 ENCOUNTER — Encounter: Payer: Self-pay | Admitting: Cardiology

## 2021-06-19 ENCOUNTER — Ambulatory Visit (INDEPENDENT_AMBULATORY_CARE_PROVIDER_SITE_OTHER): Payer: Medicaid Other | Admitting: Cardiology

## 2021-06-19 VITALS — BP 100/78 | HR 67 | Ht 60.0 in | Wt 163.0 lb

## 2021-06-19 DIAGNOSIS — Z01812 Encounter for preprocedural laboratory examination: Secondary | ICD-10-CM | POA: Diagnosis not present

## 2021-06-19 DIAGNOSIS — R0602 Shortness of breath: Secondary | ICD-10-CM

## 2021-06-19 DIAGNOSIS — I471 Supraventricular tachycardia: Secondary | ICD-10-CM

## 2021-06-19 DIAGNOSIS — Z720 Tobacco use: Secondary | ICD-10-CM | POA: Diagnosis not present

## 2021-06-19 NOTE — Patient Instructions (Addendum)
Medication Instructions:  ?Your physician recommends that you continue on your current medications as directed. Please refer to the Current Medication list given to you today. ? ?*If you need a refill on your cardiac medications before your next appointment, please call your pharmacy* ? ? ?Lab Work: ?Please stop by a [facility] by 5/5 for pre procedure blood work.  You do NOT need to be fasting. ? ?If you have labs (blood work) drawn today and your tests are completely normal, you will receive your results only by: ?MyChart Message (if you have MyChart) OR ?A paper copy in the mail ?If you have any lab test that is abnormal or we need to change your treatment, we will call you to review the results. ? ? ?Testing/Procedures: ?Your physician has recommended that you have an ablation. Catheter ablation is a medical procedure used to treat some cardiac arrhythmias (irregular heartbeats). During catheter ablation, a long, thin, flexible tube is put into a blood vessel in your groin (upper thigh), or neck. This tube is called an ablation catheter. It is then guided to your heart through the blood vessel. Radio frequency waves destroy small areas of heart tissue where abnormal heartbeats may cause an arrhythmia to start. Please see the instruction sheet given to you today. ? ? ?Follow-Up: ?At [facility], you and your health needs are our priority.  As part of our continuing mission to provide you with exceptional heart care, we have created designated Provider Care Teams.  These Care Teams include your primary Cardiologist (physician) and Advanced Practice Providers (APPs -  Physician Assistants and Nurse Practitioners) who all work together to provide you with the care you need, when you need it. ? ?Your next appointment:   ?4 week(s) after your ablation ? ?The format for your next appointment:   ?In Person ? ?Provider:   ?Allegra Lai, MD ? ? ? ?Thank you for choosing [facility]!! ? ? ?Trinidad Curet, RN ?[phone] ? ? ?Other Instructions ? ?[place] [facility]:  [address] ? ? ? Cardiac Ablation ?Cardiac ablation is a procedure to destroy (ablate) some heart tissue that is sending bad signals. These bad signals cause problems in heart rhythm. ?The heart has many areas that make these signals. If there are problems in these areas, they can make the heart beat in a way that is not normal. Destroying some tissues can help make the heart rhythm normal. ?Tell your doctor about: ?Any allergies you have. ?All medicines you are taking. These include vitamins, herbs, eye drops, creams, and over-the-counter medicines. ?Any problems you or family members have had with medicines that make you fall asleep (anesthetics). ?Any blood disorders you have. ?Any surgeries you have had. ?Any medical conditions you have, such as kidney failure. ?Whether you are pregnant or may be pregnant. ?What are the risks? ?This is a safe procedure. But problems may occur, including: ?Infection. ?Bruising and bleeding. ?Bleeding into the chest. ?Stroke or blood clots. ?Damage to nearby areas of your body. ?Allergies to medicines or dyes. ?The need for a pacemaker if the normal system is damaged. ?Failure of the procedure to treat the problem. ?What happens before the procedure? ?Medicines ?Ask your doctor about: ?Changing or stopping your normal medicines. This is important. ?Taking aspirin and ibuprofen. Do not take these medicines unless your doctor tells you to take them. ?Taking other medicines, vitamins, herbs, and supplements. ?General instructions ?Follow instructions from your doctor about what you cannot eat or drink. ?Plan  to have someone take you home from the hospital or clinic. ?If you will be going home right after the procedure, plan to have someone with you for 24 hours. ?Ask your doctor what steps will be taken to prevent infection. ?What happens during the procedure? ? ?An IV tube will be put into one of your veins. ?You  will be given a medicine to help you relax. ?The skin on your neck or groin will be numbed. ?A cut (incision) will be made in your neck or groin. A needle will be put through your cut and into a large vein. ?A tube (catheter) will be put into the needle. The tube will be moved to your heart. ?Dye may be put through the tube. This helps your doctor see your heart. ?Small devices (electrodes) on the tube will send out signals. ?A type of energy will be used to destroy some heart tissue. ?The tube will be taken out. ?Pressure will be held on your cut. This helps stop bleeding. ?A bandage will be put over your cut. ?The exact procedure may vary among doctors and hospitals. ?What happens after the procedure? ?You will be watched until you leave the hospital or clinic. This includes checking your heart rate, breathing rate, oxygen, and blood pressure. ?Your cut will be watched for bleeding. You will need to lie still for a few hours. ?Do not drive for 24 hours or as long as your doctor tells you. ?Summary ?Cardiac ablation is a procedure to destroy some heart tissue. This is done to treat heart rhythm problems. ?Tell your doctor about any medical conditions you may have. Tell him or her about all medicines you are taking to treat them. ?This is a safe procedure. But problems may occur. These include infection, bruising, bleeding, and damage to nearby areas of your body. ?Follow what your doctor tells you about food and drink. You may also be told to change or stop some of your medicines. ?After the procedure, do not drive for 24 hours or as long as your doctor tells you. ?This information is not intended to replace advice given to you by your health care provider. Make sure you discuss any questions you have with your health care provider. ?Document Revised: 01/15/2019 Document Reviewed: 01/15/2019 ?Elsevier Patient Education ? [employer]. ? ?

## 2021-06-19 NOTE — Progress Notes (Signed)
? ?Electrophysiology Office Note ? ? ?Date:  06/19/2021  ? ?ID:  Gwendolyn [name], DOB Jan 31, 1981, MRN [id number] ? ?PCP:  Richardean Chimera, MD  ?Cardiologist:  Allred ?Primary Electrophysiologist:  Nolita Kutter Jorja Loa, MD   ? ?Chief Complaint: SVT ?  ?History of Present Illness: ?Gwendolyn [name] is a 41 y.o. female who is being seen today for the evaluation of SVT at the request of Richardean Chimera, MD. Presenting today for electrophysiology evaluation. ? ?She has a history significant for bipolar disorder, migraines, SVT.  She also has sinus tachycardia.  She saw Dr. Johney Frame and was noted to be in sinus tachycardia which increased with resumption of tobacco products.  She also has SVT with abrupt onset and offset episodes.  She recently wore a cardiac monitor that showed no arrhythmias. ? ?Today, denies symptoms of palpitations, chest pain, orthopnea, PND, lower extremity edema, claudication, dizziness, presyncope, syncope, bleeding, or neurologic sequela. The patient is tolerating medications without difficulties.  Her main complaint today is shortness of breath and fatigue.  She finds it difficult to exert herself due to her shortness of breath.  She also feels like she is sleeping more.  Despite that, she has been taking her bisoprolol in the morning as it is made her not being able to fall asleep.  She had to stop taking metoprolol for the same reason and did not tolerate diltiazem due to lower extremity swelling. ? ? ?Past Medical History:  ?Diagnosis Date  ? Bipolar disorder (HCC)   ? Depression   ? GERD (gastroesophageal reflux disease)   ? Migraines   ? Palpitations   ? SVT (supraventricular tachycardia) (HCC)   ? short RP SVT documented  ? ?Past Surgical History:  ?Procedure Laterality Date  ? ABDOMINAL HYSTERECTOMY    ? CHOLECYSTECTOMY    ? TUBAL LIGATION    ? ? ? ?Current Outpatient Medications  ?Medication Sig Dispense Refill  ? bisoprolol (ZEBETA) 5 MG tablet Take 1 tablet (5 mg total) by mouth daily. 30  tablet 2  ? cetirizine (ZYRTEC) 10 MG tablet Take 10 mg by mouth daily.    ? diazepam (VALIUM) 10 MG tablet Take 10 mg by mouth 3 (three) times daily.    ? diltiazem (CARDIZEM) 30 MG tablet Take 30 mg by mouth 3 (three) times daily.    ? EMGALITY 120 MG/ML SOAJ Inject 1 mL into the skin every 30 (thirty) days.    ? ibuprofen (ADVIL) 800 MG tablet Take 800 mg by mouth 3 (three) times daily.    ? omeprazole (PRILOSEC) 40 MG capsule Take 40 mg by mouth as needed.    ? polyethylene glycol powder (GLYCOLAX/MIRALAX) 17 GM/SCOOP powder Take by mouth.    ? ?No current facility-administered medications for this visit.  ? ? ?Allergies:   Amoxicillin, Toprol xl [metoprolol], Azithromycin, and Keflex [cephalexin]  ? ?Social History:  The patient  reports that she has been smoking cigarettes. She has never used smokeless tobacco. She reports that she does not currently use alcohol. She reports that she does not currently use drugs after having used the following drugs: Marijuana.  ? ?Family History:  [name] family history includes ADD / ADHD in her daughter, son, and son; Esophageal cancer in her maternal uncle; Heart disease in her father and another family member; Liver disease in her maternal grandfather; Other in her son.  ? ?ROS:  Please see the history of present illness.   Otherwise, review of systems is positive for  none.   All other systems are reviewed and negative.  ? ?PHYSICAL EXAM: ?VS:  BP 100/78   Pulse 67   Ht 5' (1.524 m)   Wt 163 lb (73.9 kg)   LMP  (LMP Unknown)   SpO2 98%   BMI 31.83 kg/m?  , BMI Body mass index is 31.83 kg/m?. ?GEN: Well nourished, well developed, in no acute distress  ?HEENT: normal  ?Neck: no JVD, carotid bruits, or masses ?Cardiac: RRR; no murmurs, rubs, or gallops,no edema  ?Respiratory:  clear to auscultation bilaterally, normal work of breathing ?GI: soft, nontender, nondistended, + BS ?MS: no deformity or atrophy  ?Skin: warm and dry ?Neuro:  Strength and sensation are  intact ?Psych: euthymic mood, full affect ? ?EKG:  EKG is ordered today. ?Personal review of the ekg ordered shows sinus rhythm, rate 67 ? ?Recent Labs: ?10/11/2020: ALT 13; BUN 13; Creatinine, Ser 0.93; Hemoglobin 14.0; Platelets 244; Potassium 4.2; Sodium 141; TSH 3.090  ? ? ?Lipid Panel  ?No results found for: CHOL, TRIG, HDL, CHOLHDL, VLDL, LDLCALC, LDLDIRECT ? ? ?Wt Readings from Last 3 Encounters:  ?06/19/21 163 lb (73.9 kg)  ?06/07/21 162 lb 9.6 oz (73.8 kg)  ?05/25/21 160 lb 3.2 oz (72.7 kg)  ?  ? ? ?Other studies Reviewed: ?Additional studies/ records that were reviewed today include: TTE 07/12/20  ?Review of the above records today demonstrates:  ? 1. Left ventricular ejection fraction, by estimation, is 55 to 60%. The  ?left ventricle has normal function. The left ventricle has no regional  ?wall motion abnormalities. Left ventricular diastolic parameters were  ?normal.  ? 2. Right ventricular systolic function is normal. The right ventricular  ?size is normal. There is normal pulmonary artery systolic pressure.  ? 3. The mitral valve is normal in structure. Trivial mitral valve  ?regurgitation. No evidence of mitral stenosis.  ? 4. The aortic valve is tricuspid. Aortic valve regurgitation is not  ?visualized. No aortic stenosis is present.  ? 5. The inferior vena cava is normal in size with greater than 50%  ?respiratory variability, suggesting right atrial pressure of 3 mmHg.  ? ?Cardiac monitor 06/08/2021 personally reviewed ?Predominant rhythm was sinus rhythm ?<1% ventricular and supraventricular ectopy burden ?No atrial fibrillation noted ?No triggered episodes recorded ? ?ASSESSMENT AND PLAN: ? ?1.  SVT: Previously a documented short RP tachycardia.  This is found in the media tab on 03/13/2019.  She recently wore a cardiac monitor that showed no arrhythmias.  Currently on bisoprolol 5 mg daily.  At this point, she would prefer ablation as she is having symptoms of fatigue from the bisoprolol.  Risk  and benefits were discussed which include bleeding, tamponade, heart block, stroke, among others.  She understands these risks and is agreed to the procedure. ? ?2.  Sinus tachycardia: Currently well controlled ? ?3.  Tobacco abuse: Complete cessation encouraged ? ? ?Current medicines are reviewed at length with the patient today.   ?The patient does not have concerns regarding her medicines.  The following changes were made today: None ? ?Labs/ tests ordered today include:  ?Orders Placed This Encounter  ?Procedures  ? Basic metabolic panel  ? CBC  ? Pro b natriuretic peptide (BNP)  ? EKG 12-Lead  ? ? ? ?Disposition:   FU with Monte Bronder 1 months ? ?Signed, ?Francia Verry Jorja Loa, MD  ?06/19/2021 3:10 PM    ? ?[facility] ?[address] ?[address] ?[place] [address] ?[phone] ([phone] ?[phone]) ? ?

## 2021-06-20 LAB — BASIC METABOLIC PANEL
BUN/Creatinine Ratio: 10 (ref 9–23)
BUN: 9 mg/dL (ref 6–24)
CO2: 24 mmol/L (ref 20–29)
Calcium: 9.8 mg/dL (ref 8.7–10.2)
Chloride: 103 mmol/L (ref 96–106)
Creatinine, Ser: 0.92 mg/dL (ref 0.57–1.00)
Glucose: 84 mg/dL (ref 70–99)
Potassium: 4.9 mmol/L (ref 3.5–5.2)
Sodium: 140 mmol/L (ref 134–144)
eGFR: 80 mL/min/{1.73_m2} (ref >59)

## 2021-06-20 LAB — CBC
Hematocrit: 42.4 % (ref 34.0–46.6)
Hemoglobin: 14.1 g/dL (ref 11.1–15.9)
MCH: 27.2 pg (ref 26.6–33.0)
MCHC: 33.3 g/dL (ref 31.5–35.7)
MCV: 82 fL (ref 79–97)
Platelets: 269 10*3/uL (ref 150–450)
RBC: 5.19 x10E6/uL (ref 3.77–5.28)
RDW: 13.7 % (ref 11.7–15.4)
WBC: 5.5 10*3/uL (ref 3.4–10.8)

## 2021-06-20 LAB — PRO B NATRIURETIC PEPTIDE: NT-Pro BNP: 51 pg/mL (ref 0–130)

## 2021-06-29 ENCOUNTER — Ambulatory Visit: Payer: Medicaid Other | Admitting: Physician Assistant

## 2021-07-04 ENCOUNTER — Encounter: Payer: Self-pay | Admitting: Cardiology

## 2021-07-04 NOTE — Telephone Encounter (Signed)
Pt rescheduled to 6/15. ?Aware will send updated procedure information via mychart. ?Patient verbalized understanding and agreeable to plan.  ? ?

## 2021-07-25 ENCOUNTER — Encounter: Payer: Self-pay | Admitting: Cardiology

## 2021-08-02 ENCOUNTER — Other Ambulatory Visit: Payer: Self-pay | Admitting: *Deleted

## 2021-08-02 ENCOUNTER — Encounter: Payer: Self-pay | Admitting: *Deleted

## 2021-08-02 DIAGNOSIS — Z01812 Encounter for preprocedural laboratory examination: Secondary | ICD-10-CM

## 2021-08-02 DIAGNOSIS — I471 Supraventricular tachycardia: Secondary | ICD-10-CM

## 2021-08-04 LAB — CBC
Hematocrit: 42.9 % (ref 34.0–46.6)
Hemoglobin: 14.3 g/dL (ref 11.1–15.9)
MCH: 27.1 pg (ref 26.6–33.0)
MCHC: 33.3 g/dL (ref 31.5–35.7)
MCV: 81 fL (ref 79–97)
Platelets: 278 10*3/uL (ref 150–450)
RBC: 5.28 x10E6/uL (ref 3.77–5.28)
RDW: 13.9 % (ref 11.7–15.4)
WBC: 5.3 10*3/uL (ref 3.4–10.8)

## 2021-08-04 LAB — BASIC METABOLIC PANEL
BUN/Creatinine Ratio: 9 (ref 9–23)
BUN: 9 mg/dL (ref 6–24)
CO2: 22 mmol/L (ref 20–29)
Calcium: 9.5 mg/dL (ref 8.7–10.2)
Chloride: 103 mmol/L (ref 96–106)
Creatinine, Ser: 0.97 mg/dL (ref 0.57–1.00)
Glucose: 103 mg/dL — ABNORMAL HIGH (ref 70–99)
Potassium: 4 mmol/L (ref 3.5–5.2)
Sodium: 140 mmol/L (ref 134–144)
eGFR: 75 mL/min/{1.73_m2} (ref >59)

## 2021-08-09 NOTE — Pre-Procedure Instructions (Signed)
Instructed patient on the following items: Arrival time 0830 Nothing to eat or drink after midnight No meds AM of procedure Responsible person to drive you home and stay with you for 24 hrs    

## 2021-08-10 ENCOUNTER — Other Ambulatory Visit: Payer: Self-pay

## 2021-08-10 ENCOUNTER — Ambulatory Visit (HOSPITAL_BASED_OUTPATIENT_CLINIC_OR_DEPARTMENT_OTHER): Payer: Medicaid Other | Admitting: Anesthesiology

## 2021-08-10 ENCOUNTER — Ambulatory Visit (HOSPITAL_COMMUNITY): Payer: Medicaid Other | Admitting: Anesthesiology

## 2021-08-10 ENCOUNTER — Encounter (HOSPITAL_COMMUNITY): Payer: Self-pay | Admitting: Cardiology

## 2021-08-10 ENCOUNTER — Ambulatory Visit (HOSPITAL_COMMUNITY)
Admission: RE | Disposition: A | Discharge status: Home / Self Care | Payer: Self-pay | Source: Home / Self Care | Attending: Cardiology

## 2021-08-10 ENCOUNTER — Ambulatory Visit (HOSPITAL_COMMUNITY)
Admission: RE | Admit: 2021-08-10 | Discharge: 2021-08-10 | Disposition: A | Discharge status: Home / Self Care | Payer: Medicaid Other | Attending: Cardiology | Admitting: Cardiology

## 2021-08-10 ENCOUNTER — Telehealth: Payer: Self-pay | Admitting: Cardiology

## 2021-08-10 DIAGNOSIS — F17210 Nicotine dependence, cigarettes, uncomplicated: Secondary | ICD-10-CM | POA: Diagnosis not present

## 2021-08-10 DIAGNOSIS — E669 Obesity, unspecified: Secondary | ICD-10-CM

## 2021-08-10 DIAGNOSIS — Z6841 Body Mass Index (BMI) 40.0 and over, adult: Secondary | ICD-10-CM

## 2021-08-10 DIAGNOSIS — F319 Bipolar disorder, unspecified: Secondary | ICD-10-CM | POA: Insufficient documentation

## 2021-08-10 DIAGNOSIS — I471 Supraventricular tachycardia: Secondary | ICD-10-CM

## 2021-08-10 HISTORY — PX: SVT ABLATION: EP1225

## 2021-08-10 SURGERY — SVT ABLATION
Anesthesia: Monitor Anesthesia Care

## 2021-08-10 MED ORDER — ACETAMINOPHEN 325 MG PO TABS: 650.0000 mg | ORAL_TABLET | ORAL | Status: DC | PRN

## 2021-08-10 MED ORDER — MIDAZOLAM HCL 2 MG/2ML IJ SOLN
INTRAMUSCULAR | Status: DC | PRN
  Administered 2021-08-10: 4 mg via INTRAVENOUS

## 2021-08-10 MED ORDER — BUPIVACAINE HCL (PF) 0.25 % IJ SOLN
INTRAMUSCULAR | Status: AC
  Filled 2021-08-10: qty 30

## 2021-08-10 MED ORDER — SODIUM CHLORIDE 0.9% FLUSH: 3.0000 mL | INTRAVENOUS | Status: DC | PRN

## 2021-08-10 MED ORDER — ONDANSETRON HCL 4 MG/2ML IJ SOLN
INTRAMUSCULAR | Status: DC | PRN
  Administered 2021-08-10: 4 mg via INTRAVENOUS

## 2021-08-10 MED ORDER — SODIUM CHLORIDE 0.9% FLUSH: 3.0000 mL | Freq: Two times a day (BID) | INTRAVENOUS | Status: DC

## 2021-08-10 MED ORDER — ONDANSETRON HCL 4 MG/2ML IJ SOLN: 4.0000 mg | Freq: Four times a day (QID) | INTRAMUSCULAR | Status: DC | PRN

## 2021-08-10 MED ORDER — PROPOFOL 500 MG/50ML IV EMUL
INTRAVENOUS | Status: DC | PRN
Start: 2021-08-10 — End: 2021-08-10
  Administered 2021-08-10: 100 ug/kg/min via INTRAVENOUS

## 2021-08-10 MED ORDER — PROPOFOL 10 MG/ML IV BOLUS
INTRAVENOUS | Status: DC | PRN
  Administered 2021-08-10: 40 mg via INTRAVENOUS

## 2021-08-10 MED ORDER — HEPARIN (PORCINE) IN NACL 1000-0.9 UT/500ML-% IV SOLN
INTRAVENOUS | Status: AC
  Filled 2021-08-10: qty 500

## 2021-08-10 MED ORDER — BUPIVACAINE HCL (PF) 0.25 % IJ SOLN
INTRAMUSCULAR | Status: DC | PRN
  Administered 2021-08-10 (×2): 15 mL

## 2021-08-10 MED ORDER — FENTANYL CITRATE (PF) 100 MCG/2ML IJ SOLN
INTRAMUSCULAR | Status: DC | PRN
Start: 2021-08-10 — End: 2021-08-10
  Administered 2021-08-10: 100 ug via INTRAVENOUS

## 2021-08-10 MED ORDER — SODIUM CHLORIDE 0.9 % IV SOLN: INTRAVENOUS | Status: DC

## 2021-08-10 MED ORDER — HEPARIN (PORCINE) IN NACL 1000-0.9 UT/500ML-% IV SOLN
INTRAVENOUS | Status: DC | PRN
  Administered 2021-08-10 (×2): 500 mL

## 2021-08-10 MED ORDER — SODIUM CHLORIDE 0.9 % IV SOLN: 250.0000 mL | INTRAVENOUS | Status: DC | PRN

## 2021-08-10 SURGICAL SUPPLY — 13 items
BAG SNAP BAND KOVER 36X36 (MISCELLANEOUS) ×1 IMPLANT
CATH EZ STEER NAV 4MM D-F CUR (ABLATOR) ×1 IMPLANT
CATH JOSEPH QUAD ALLRED 6F REP (CATHETERS) ×2 IMPLANT
CATH WEBSTER BI DIR CS D-F CRV (CATHETERS) ×1 IMPLANT
CLOSURE PERCLOSE PROSTYLE (VASCULAR PRODUCTS) ×4 IMPLANT
PACK EP LATEX FREE (CUSTOM PROCEDURE TRAY) ×2
PACK EP LF (CUSTOM PROCEDURE TRAY) ×1 IMPLANT
PAD DEFIB RADIO PHYSIO CONN (PAD) ×2 IMPLANT
PATCH CARTO3 (PAD) ×1 IMPLANT
SHEATH PINNACLE 6F 10CM (SHEATH) ×2 IMPLANT
SHEATH PINNACLE 7F 10CM (SHEATH) ×1 IMPLANT
SHEATH PINNACLE 8F 10CM (SHEATH) ×1 IMPLANT
SHEATH PROBE COVER 6X72 (BAG) ×1 IMPLANT

## 2021-08-10 NOTE — Discharge Instructions (Signed)

## 2021-08-10 NOTE — Transfer of Care (Signed)
Immediate Anesthesia Transfer of Care Note  Patient: Gwendolyn [name]  Procedure(s) Performed: SVT ABLATION  Patient Location: PACU  Anesthesia Type:MAC  Level of Consciousness: drowsy  Airway & Oxygen Therapy: Patient Spontanous Breathing and Patient connected to face mask oxygen  Post-op Assessment: Report given to RN and Post -op Vital signs reviewed and stable  Post vital signs: Reviewed and stable  Last Vitals:  Vitals Value Taken Time  BP 97/59 08/10/21 1209  Temp    Pulse 68 08/10/21 1211  Resp 14 08/10/21 1211  SpO2 100 % 08/10/21 1211  Vitals shown include unvalidated device data.  Last Pain:  Vitals:   08/10/21 0844  TempSrc: Oral         Complications: There were no known notable events for this encounter.

## 2021-08-10 NOTE — Telephone Encounter (Signed)
Pt calling because she has additional questions about her ablation. She asked to get a call back today

## 2021-08-10 NOTE — Anesthesia Preprocedure Evaluation (Addendum)
Anesthesia Evaluation  Patient identified by MRN, date of birth, ID band Patient awake    Reviewed: Allergy & Precautions, NPO status , Patient's Chart, lab work & pertinent test results  Airway Mallampati: II  TM Distance: >3 FB Neck ROM: Full    Dental  (+) Dental Advisory Given, Poor Dentition, Missing, Chipped,    Pulmonary Current Smoker,  1ppd for many years, recently switched from cigarettes to e-cigarettes Only uses albuterol inhaler rarely    Pulmonary exam normal breath sounds clear to auscultation       Cardiovascular Normal cardiovascular exam+ dysrhythmias Supra Ventricular Tachycardia  Rhythm:Regular Rate:Normal  TTE 07/12/20  Review of the above records today demonstrates:  1. Left ventricular ejection fraction, by estimation, is 55 to 60%. The  left ventricle has normal function. The left ventricle has no regional  wall motion abnormalities. Left ventricular diastolic parameters were  normal.  2. Right ventricular systolic function is normal. The right ventricular  size is normal. There is normal pulmonary artery systolic pressure.  3. The mitral valve is normal in structure. Trivial mitral valve  regurgitation. No evidence of mitral stenosis.  4. The aortic valve is tricuspid. Aortic valve regurgitation is not  visualized. No aortic stenosis is present.  5. The inferior vena cava is normal in size with greater than 50%  respiratory variability, suggesting right atrial pressure of 3 mmHg.   Cardiac monitor 06/08/2021 personally reviewed Predominant rhythm was sinus rhythm <1% ventricular and supraventricular ectopy burden No atrial fibrillation noted No triggered episodes recorded   Neuro/Psych  Headaches, PSYCHIATRIC DISORDERS Depression Bipolar Disorder    GI/Hepatic Neg liver ROS, GERD  Medicated and Controlled,  Endo/Other  negative endocrine ROS  Renal/GU negative Renal ROS  negative  genitourinary   Musculoskeletal negative musculoskeletal ROS (+)   Abdominal (+) + obese,   Peds  Hematology negative hematology ROS (+) hct 42.9   Anesthesia Other Findings   Reproductive/Obstetrics negative OB ROS                            Anesthesia Physical Anesthesia Plan  ASA: 3  Anesthesia Plan: MAC   Post-op Pain Management:    Induction:   PONV Risk Score and Plan: 2 and Propofol infusion and TIVA  Airway Management Planned: Natural Airway and Simple Face Mask  Additional Equipment: None  Intra-op Plan:   Post-operative Plan:   Informed Consent: I have reviewed the patients History and Physical, chart, labs and discussed the procedure including the risks, benefits and alternatives for the proposed anesthesia with the patient or authorized representative who has indicated his/her understanding and acceptance.       Plan Discussed with: CRNA  Anesthesia Plan Comments: (Extremely anxious in preop- took her own valium this AM, but still very tearful. States that preop versed normally does not help her much)        Anesthesia Quick Evaluation

## 2021-08-10 NOTE — Telephone Encounter (Signed)
Pt reports she was "out of it" when leaving hospital. She accidentally hit her vape and then saw her d/c instructions. She is doing her best to stop, she has already quit smoking and went to vaping as she works her way off nicotine. Pt aware she is ok but reminded to continue her great battle in quitting. Patient verbalized understanding and agreeable to plan.

## 2021-08-10 NOTE — Anesthesia Procedure Notes (Signed)
Procedure Name: MAC Date/Time: 08/10/2021 10:47 AM  Performed by: Imagene Riches, CRNAPre-anesthesia Checklist: Patient identified, Emergency Drugs available, Suction available, Patient being monitored and Timeout performed Patient Re-evaluated:Patient Re-evaluated prior to induction Oxygen Delivery Method: Simple face mask

## 2021-08-10 NOTE — H&P (Signed)
Electrophysiology Office Note   Date:  08/10/2021   ID:  Gwendolyn [name], DOB Jul 08, 1980, MRN [id number]  PCP:  Richardean Chimera, MD  Cardiologist:  Allred Primary Electrophysiologist:  Sarajane Fambrough Jorja Loa, MD    Chief Complaint: SVT   History of Present Illness: Gwendolyn [name] is a 41 y.o. female who is being seen today for the evaluation of SVT at the request of No ref. provider found. Presenting today for electrophysiology evaluation.  She has a history significant for bipolar disorder, migraines, SVT.  She also has sinus tachycardia.  She saw Dr. Johney Frame and was noted to be in sinus tachycardia which increased with resumption of tobacco products.  She also has SVT with abrupt onset and offset episodes.  She recently wore a cardiac monitor that showed no arrhythmias.  Today, denies symptoms of palpitations, chest pain, shortness of breath, orthopnea, PND, lower extremity edema, claudication, dizziness, presyncope, syncope, bleeding, or neurologic sequela. The patient is tolerating medications without difficulties. Plan ablation today.    Past Medical History:  Diagnosis Date   Bipolar disorder (HCC)    Depression    GERD (gastroesophageal reflux disease)    Migraines    Palpitations    SVT (supraventricular tachycardia) (HCC)    short RP SVT documented   Past Surgical History:  Procedure Laterality Date   ABDOMINAL HYSTERECTOMY     CHOLECYSTECTOMY     TUBAL LIGATION       Current Facility-Administered Medications  Medication Dose Route Frequency Provider Last Rate Last Admin   0.9 %  sodium chloride infusion   Intravenous Continuous Adalei Novell, Andree Coss, MD 50 mL/hr at 08/10/21 0850 New Bag at 08/10/21 0850    Allergies:   Amoxicillin, Toprol xl [metoprolol], Azithromycin, and Keflex [cephalexin]   Social History:  The patient  reports that she has been smoking cigarettes. She has never used smokeless tobacco. She reports that she does not currently use alcohol. She  reports that she does not currently use drugs after having used the following drugs: Marijuana.   Family History:  [name] family history includes ADD / ADHD in her daughter, son, and son; Esophageal cancer in her maternal uncle; Heart disease in her father and another family member; Liver disease in her maternal grandfather; Other in her son.   ROS:  Please see the history of present illness.   Otherwise, review of systems is positive for none.   All other systems are reviewed and negative.   PHYSICAL EXAM: VS:  BP 104/65   Pulse 83   Temp 98 F (36.7 C) (Oral)   Resp 18   Ht 5' (1.524 m)   Wt 75.8 kg   LMP  (LMP Unknown)   SpO2 99%   BMI 32.61 kg/m  , BMI Body mass index is 32.61 kg/m. GEN: Well nourished, well developed, in no acute distress  HEENT: normal  Neck: no JVD, carotid bruits, or masses Cardiac: RRR; no murmurs, rubs, or gallops,no edema  Respiratory:  clear to auscultation bilaterally, normal work of breathing GI: soft, nontender, nondistended, + BS MS: no deformity or atrophy  Skin: warm and dry Neuro:  Strength and sensation are intact Psych: euthymic mood, full affect  Recent Labs: 10/11/2020: ALT 13; TSH 3.090 06/19/2021: NT-Pro BNP 51 08/03/2021: BUN 9; Creatinine, Ser 0.97; Hemoglobin 14.3; Platelets 278; Potassium 4.0; Sodium 140    Lipid Panel  No results found for: "CHOL", "TRIG", "HDL", "CHOLHDL", "VLDL", "LDLCALC", "LDLDIRECT"   Wt Readings from Last  3 Encounters:  08/10/21 75.8 kg  06/19/21 73.9 kg  06/07/21 73.8 kg      Other studies Reviewed: Additional studies/ records that were reviewed today include: TTE 07/12/20  Review of the above records today demonstrates:   1. Left ventricular ejection fraction, by estimation, is 55 to 60%. The  left ventricle has normal function. The left ventricle has no regional  wall motion abnormalities. Left ventricular diastolic parameters were  normal.   2. Right ventricular systolic function is  normal. The right ventricular  size is normal. There is normal pulmonary artery systolic pressure.   3. The mitral valve is normal in structure. Trivial mitral valve  regurgitation. No evidence of mitral stenosis.   4. The aortic valve is tricuspid. Aortic valve regurgitation is not  visualized. No aortic stenosis is present.   5. The inferior vena cava is normal in size with greater than 50%  respiratory variability, suggesting right atrial pressure of 3 mmHg.   Cardiac monitor 06/08/2021 personally reviewed Predominant rhythm was sinus rhythm <1% ventricular and supraventricular ectopy burden No atrial fibrillation noted No triggered episodes recorded  ASSESSMENT AND PLAN:  1.  SVT: Gwendolyn [name] has presented today for surgery, with the diagnosis of SVT.  The various methods of treatment have been discussed with the patient and family. After consideration of risks, benefits and other options for treatment, the patient has consented to  Procedure(s): Catheter ablation as a surgical intervention .  Risks include but not limited to complete heart block, stroke, esophageal damage, nerve damage, bleeding, vascular damage, tamponade, perforation, MI, and death. [name] history has been reviewed, patient examined, no change in status, stable for surgery.  I have reviewed the patient's chart and labs.  Questions were answered to the patient's satisfaction.    Nyjae Hodge Elberta Fortis, MD 08/10/2021 10:03 AM

## 2021-08-11 ENCOUNTER — Encounter (HOSPITAL_COMMUNITY): Payer: Self-pay | Admitting: Cardiology

## 2021-08-11 MED FILL — Bupivacaine HCl Preservative Free (PF) Inj 0.25%: INTRAMUSCULAR | Qty: 30 | Status: AC

## 2021-08-16 NOTE — Anesthesia Postprocedure Evaluation (Signed)
Anesthesia Post Note  Patient: Gwendolyn [name]  Procedure(s) Performed: SVT ABLATION     Patient location during evaluation: Cath Lab Anesthesia Type: MAC Level of consciousness: awake and alert Pain management: pain level controlled Vital Signs Assessment: post-procedure vital signs reviewed and stable Respiratory status: spontaneous breathing, nonlabored ventilation, respiratory function stable and patient connected to nasal cannula oxygen Cardiovascular status: stable and blood pressure returned to baseline Postop Assessment: no apparent nausea or vomiting Anesthetic complications: no   There were no known notable events for this encounter.  Last Vitals:  Vitals:   08/10/21 1400 08/10/21 1430  BP: 105/65 106/62  Pulse: 77 73  Resp: 13 (!) 24  Temp:    SpO2: 100% 100%    Last Pain:  Vitals:   08/10/21 1300  TempSrc:   PainSc: 0-No pain                 Royale Swamy S

## 2021-08-17 ENCOUNTER — Telehealth: Payer: Self-pay | Admitting: Cardiology

## 2021-08-17 NOTE — Telephone Encounter (Signed)
Returned call to patient. Patient has concerns that some symptoms she is having could be related to recent ablation. She reports new onset of lower abdominal pain. After further assessment she also reports some burning with urination and an odor to her urine. She denies any cardiac symptoms and states she has not had any palpitations, SOB or CP since her ablation.  Advised patient that she might have a UTI and should go to her PCP or urgent care to be tested.  Patient agreed to this plan.   No other questions or concerns at this time.

## 2021-08-17 NOTE — Telephone Encounter (Signed)
Follow Up:      Patient said she had an Ablation last Thursday(08-10-21). She said she wants to talk to Dr Gershon Crane nurse, she says she have some questions please. She says she is hurting in her lower abdomen.

## 2021-08-18 NOTE — Telephone Encounter (Signed)
Pt did go to urgent care. No UTI, but they did take cultures and prescribed her Cipro to take. She is not going to start the Cipro for now, she would like to see what the cultures reveal. She is aware this is not ablation related.  On another note -- she wants Dr. Elberta Fortis to know how much better she is doing. Says "I wanna hug that man when I see him next".  "I feel like a teenager again" as she reports she has been dealing with this since she was 15. She really appreciates Dr. Elberta Fortis helping resolve this issue, and hopefully for good. She will see [place] at follow up in next several weeks.

## 2021-09-18 ENCOUNTER — Ambulatory Visit (INDEPENDENT_AMBULATORY_CARE_PROVIDER_SITE_OTHER): Payer: Medicaid Other | Admitting: Cardiology

## 2021-09-18 ENCOUNTER — Encounter: Payer: Self-pay | Admitting: Cardiology

## 2021-09-18 VITALS — BP 112/70 | HR 79 | Ht 60.0 in | Wt 158.4 lb

## 2021-09-18 DIAGNOSIS — I471 Supraventricular tachycardia: Secondary | ICD-10-CM | POA: Diagnosis not present

## 2021-09-18 NOTE — Progress Notes (Signed)
Electrophysiology Office Note   Date:  09/18/2021   ID:  Gwendolyn [name], DOB 09/20/1980, MRN [id number]  PCP:  Richardean Chimera, MD  Cardiologist:  Allred Primary Electrophysiologist:  Aldin Drees Jorja Loa, MD    Chief Complaint: SVT   History of Present Illness: Gwendolyn [name] is a 41 y.o. female who is being seen today for the evaluation of SVT at the request of Richardean Chimera, MD. Presenting today for electrophysiology evaluation.  Has a history significant for bipolar disorder, migraines, SVT.  She also has sinus tachycardia.  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  Since ablation she has done well.  She has only had 1 episode of tachycardia with heart rates of 140 bpm.  This episode lasted for approximately 10 minutes.  She is overall quite comfortable with her control.  Today, denies symptoms of palpitations, chest pain, shortness of breath, orthopnea, PND, lower extremity edema, claudication, dizziness, presyncope, syncope, bleeding, or neurologic sequela. The patient is tolerating medications without difficulties.     Past Medical History:  Diagnosis Date   Bipolar disorder (HCC)    Depression    GERD (gastroesophageal reflux disease)    Migraines    Palpitations    SVT (supraventricular tachycardia) (HCC)    short RP SVT documented   Past Surgical History:  Procedure Laterality Date   ABDOMINAL HYSTERECTOMY     CHOLECYSTECTOMY     SVT ABLATION N/A 08/10/2021   Procedure: SVT ABLATION;  Surgeon: Regan Lemming, MD;  Location: [facility];  Service: Cardiovascular;  Laterality: N/A;   TUBAL LIGATION       Current Outpatient Medications  Medication Sig Dispense Refill   albuterol (VENTOLIN HFA) 108 (90 Base) MCG/ACT inhaler Inhale 2 puffs into the lungs every 6 (six) hours as needed for wheezing or shortness of breath.     bisoprolol (ZEBETA) 5 MG tablet Take 1 tablet (5 mg total) by mouth daily. (Patient not taking: Reported on 06/29/2021)  30 tablet 2   cetirizine (ZYRTEC) 10 MG tablet Take 10 mg by mouth daily.     cyclobenzaprine (FLEXERIL) 5 MG tablet Take 5 mg by mouth 3 (three) times daily as needed for muscle spasms.     diazepam (VALIUM) 10 MG tablet Take 10 mg by mouth See admin instructions. Take 10 mg by mouth 3 times daily, may take a 4th 10 mg tablet as needed for SVT flare     diltiazem (CARDIZEM) 30 MG tablet Take 30 mg by mouth 5 (five) times daily as needed (SVT flare).     norethindrone (MICRONOR) 0.35 MG tablet Take 1 tablet by mouth daily.     omeprazole (PRILOSEC) 40 MG capsule Take 40 mg by mouth daily.     traMADol (ULTRAM) 50 MG tablet Take 50 mg by mouth every 6 (six) hours.     No current facility-administered medications for this visit.    Allergies:   Amoxicillin, Toprol xl [metoprolol], Azithromycin, and Keflex [cephalexin]   Social History:  The patient  reports that she has been smoking cigarettes. She has never used smokeless tobacco. She reports that she does not currently use alcohol. She reports that she does not currently use drugs after having used the following drugs: Marijuana.   Family History:  [name] family history includes ADD / ADHD in her daughter, son, and son; Esophageal cancer in her maternal uncle; Heart disease in her father and another family member; Liver disease in  her maternal grandfather; Other in her son.   ROS:  Please see the history of present illness.   Otherwise, review of systems is positive for none.   All other systems are reviewed and negative.   PHYSICAL EXAM: VS:  BP 112/70   Pulse 79   Ht 5' (1.524 m)   Wt 158 lb 6.4 oz (71.8 kg)   LMP  (LMP Unknown)   SpO2 99%   BMI 30.94 kg/m  , BMI Body mass index is 30.94 kg/m. GEN: Well nourished, well developed, in no acute distress  HEENT: normal  Neck: no JVD, carotid bruits, or masses Cardiac: RRR; no murmurs, rubs, or gallops,no edema  Respiratory:  clear to auscultation bilaterally, normal work of  breathing GI: soft, nontender, nondistended, + BS MS: no deformity or atrophy  Skin: warm and dry Neuro:  Strength and sensation are intact Psych: euthymic mood, full affect  EKG:  EKG is ordered today. Personal review of the ekg ordered shows sinus rhythm, PACs  Recent Labs: 10/11/2020: ALT 13; TSH 3.090 06/19/2021: NT-Pro BNP 51 08/03/2021: BUN 9; Creatinine, Ser 0.97; Hemoglobin 14.3; Platelets 278; Potassium 4.0; Sodium 140    Lipid Panel  No results found for: "CHOL", "TRIG", "HDL", "CHOLHDL", "VLDL", "LDLCALC", "LDLDIRECT"   Wt Readings from Last 3 Encounters:  09/18/21 158 lb 6.4 oz (71.8 kg)  08/10/21 167 lb (75.8 kg)  06/19/21 163 lb (73.9 kg)      Other studies Reviewed: Additional studies/ records that were reviewed today include: TTE 07/12/20  Review of the above records today demonstrates:   1. Left ventricular ejection fraction, by estimation, is 55 to 60%. The  left ventricle has normal function. The left ventricle has no regional  wall motion abnormalities. Left ventricular diastolic parameters were  normal.   2. Right ventricular systolic function is normal. The right ventricular  size is normal. There is normal pulmonary artery systolic pressure.   3. The mitral valve is normal in structure. Trivial mitral valve  regurgitation. No evidence of mitral stenosis.   4. The aortic valve is tricuspid. Aortic valve regurgitation is not  visualized. No aortic stenosis is present.   5. The inferior vena cava is normal in size with greater than 50%  respiratory variability, suggesting right atrial pressure of 3 mmHg.   Cardiac monitor 06/08/2021 personally reviewed Predominant rhythm was sinus rhythm <1% ventricular and supraventricular ectopy burden No atrial fibrillation noted No triggered episodes recorded  ASSESSMENT AND PLAN:  1.  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  Has had minimal symptoms since her ablation.  She has stopped all of her cardiac  medications.  2.  Sinus tachycardia: Currently well controlled  3.  Tobacco abuse: Complete cessation encouraged  I Larya Charpentier follow her up as needed.  She Rosalind Guido call us back if she has further symptoms.   Current medicines are reviewed at length with the patient today.   The patient does not have concerns regarding her medicines.  The following changes were made today: none  Labs/ tests ordered today include:  No orders of the defined types were placed in this encounter.    Disposition:   FU with Liliana Dang PRN months  Signed, Aleck Locklin Jorja Loa, MD  09/18/2021 4:44 PM     [facility] [address] [place] [address] [phone] (office) [phone] (fax)

## 2021-09-19 NOTE — Addendum Note (Signed)
Addended by: Mariam Dollar on: 09/19/2021 09:31 AM   Modules accepted: Orders

## 2022-04-12 ENCOUNTER — Telehealth: Payer: Self-pay | Admitting: Cardiology

## 2022-04-12 DIAGNOSIS — R002 Palpitations: Secondary | ICD-10-CM

## 2022-04-12 NOTE — Telephone Encounter (Signed)
Patient called stating her PCP advised her to give [place] a call to ask if Dr. Curt Bears would want her to take any medication since her ablation.  She states when she is up moving around her HR goes up to 130-150.  She is not sure if that is normal or not.  She was talking to her PCP about it and her PCP told to her speak to [place] about it.

## 2022-04-13 ENCOUNTER — Encounter: Payer: Self-pay | Admitting: *Deleted

## 2022-04-13 NOTE — Telephone Encounter (Signed)
Returned pt call Reports she has been sick for the past few months and has noticed the HRs elevated during this time. Aware that those could be related to recent illness, but pt would like to have a monitor just to be sure. Dr. Curt Bears agreeable. Pt aware 2 week monitor will be mailed to home address. Patient verbalized understanding and agreeable to plan.

## 2022-04-16 ENCOUNTER — Ambulatory Visit: Payer: Medicaid Other | Attending: Cardiology

## 2022-04-16 DIAGNOSIS — R002 Palpitations: Secondary | ICD-10-CM | POA: Diagnosis not present

## 2022-04-16 NOTE — Progress Notes (Unsigned)
Enrolled for [employer] to mail a ZIO XT long term holter monitor to the patients address on file.   Patient received [id number] and applied it incorrectly.  [employer] contacted to cancel charges.  Patient came to office to have redo serial # [id number]  from office inventroy applied to patient .  Neeral at [facility] notified to associated new serial number with original enrollment.

## 2022-04-19 ENCOUNTER — Telehealth: Payer: Self-pay | Admitting: *Deleted

## 2022-04-19 ENCOUNTER — Encounter: Payer: Self-pay | Admitting: Cardiology

## 2022-04-19 NOTE — Telephone Encounter (Signed)
ZIO XT monitor mailed to patient 04/16/22 was applied incorrectly.   Patient coming in Monday , 04/23/22 at 1:00PM to have another monitor applied. We will contact [employer] Monday to cancel [id number] and to assign a new serial number to her enrollment.

## 2022-04-19 NOTE — Telephone Encounter (Signed)
Error

## 2022-04-19 NOTE — Telephone Encounter (Signed)
-----   Message from April Henson sent at 04/19/2022  4:56 PM EST ----- Contact: [phone] Pt is calling in regards to heart monitor. She believes she may have put it on incorrectly and needs a call back to discuss.

## 2022-04-19 NOTE — Telephone Encounter (Signed)
-----   Message from Gwendolyn [name] sent at 04/19/2022  4:56 PM EST ----- Contact: [phone] Pt is calling in regards to heart monitor. She believes she may have put it on incorrectly and needs a call back to discuss.

## 2022-04-23 ENCOUNTER — Ambulatory Visit: Payer: Medicaid Other | Attending: Cardiology

## 2022-06-07 ENCOUNTER — Telehealth: Payer: Self-pay | Admitting: Cardiology

## 2022-06-07 NOTE — Telephone Encounter (Signed)
Pt would like Sherri, Dr. Elberta Fortis RN to give her a call regarding the heart monitor that she wore. Please c/b.

## 2022-06-07 NOTE — Telephone Encounter (Addendum)
Pt reports seeing result and was concerned.  It mentioned HR in the 40s. Along with fast HRs.  Pt aware MD needs to interpret monitor first. That what she is seeing is computer interpretation and not MDs. She is aware we will call her once MD reviews/advises. Patient verbalized understanding and agreeable to plan.   Forwarding to MD for [facility]

## 2022-06-08 NOTE — Telephone Encounter (Signed)
Pt aware of MD recommendations. Pt comfortable with monitoring things for now and will let us know if she becomes symptomatic. She appreciates the return call.

## 2022-06-08 NOTE — Telephone Encounter (Signed)
Patient states she received a call and is requesting return call.

## 2022-12-11 ENCOUNTER — Telehealth: Payer: Self-pay | Admitting: Cardiology

## 2022-12-11 NOTE — Telephone Encounter (Signed)
Spoke with patient and she has been experiencing SOB with exertion for 3 months. It is getting worse and her HR has been elevated as well. She states her HR has been as high as 130.  Denies any chest pain, swelling, headache, nausea or vomiting. Scheduled appointment with DOD. Discussed ED precautions.

## 2022-12-11 NOTE — Telephone Encounter (Signed)
Patient is requesting echocardiogram ordered. Requesting call back to discuss.

## 2022-12-12 NOTE — Progress Notes (Signed)
Cardiology Office Note:   Date:  12/14/2022  ID:  Gwendolyn [name], DOB Jul 18, 1980, MRN [id number] PCP:  Richardean Chimera, MD  [facility] Providers DOD cardiologist:  Alverda Skeans, MD EP provider: Marlowe Kays, MD Cardiologist: Verne Carrow, MD Referring MD: Richardean Chimera, MD  Chief Complaint/Reason for Referral: DOD appointment for dyspnea ASSESSMENT:    1. SOB (shortness of breath)   2. SVT (supraventricular tachycardia) (HCC)   3. CKD (chronic kidney disease) stage 2, GFR 60-89 ml/min   4. BMI 31.0-31.9,adult     PLAN:   In order of problems listed above: Dyspnea: Will obtain echocardiogram, coronary CTA, and monitor to evaluate further. SVT: Status post ablation; obtain monitor to evaluate further. CKD stage II: Monitor for now.  Creatinine drawn December 06, 2022 was 0.86 Elevated BMI: Diet and exercise modification.      Dispo:  Return in about 3 months (around 03/16/2023).      Medication Adjustments/Labs and Tests Ordered: Current medicines are reviewed at length with Gwendolyn [name] today.  Concerns regarding medicines are outlined above.  Gwendolyn following changes have been made:  no change   Labs/tests ordered: Orders Placed This Encounter  Procedures   CT CORONARY MORPH W/CTA COR W/SCORE W/CA W/CM &/OR WO/CM   EKG 12-Lead   ECHOCARDIOGRAM COMPLETE    Medication Changes: Meds ordered this encounter  Medications   DISCONTD: ivabradine (CORLANOR) 7.5 MG TABS tablet    Sig: Take 2 tablets (15 mg total) by mouth once for 1 dose. Take 90-120 minutes prior to scan.    Dispense:  2 tablet    Refill:  0    Current medicines are reviewed at length with Gwendolyn [name] today.  Gwendolyn [name] does not have concerns regarding medicines.  History of Present Illness:      FOCUSED PROBLEM LIST:   AVNRT status post ablation 2023 Bipolar disorder BMI 31 CKD stage II Tobacco abuse 25 years   Gwendolyn [name] is a 42 year old female with above listed medical problems  here for expedited DOD appointment due to dyspnea.  Gwendolyn [name] contacted our office with shortness of breath with exertion for 3 months.  She also noticed high heart rate.    She is here with her significant other.  She tells me over Gwendolyn last 3 months she has noticed increasing shortness of breath.  This is sometimes associated with wheezing but not always.  This happens with moderate exertion and has become a routine occurrence over these last months.  Her weight has remained rather stable at 140 pounds.  She continues to smoke cigarettes but is down to about half a pack a day.  She has been smoking since she was about 42 years old.  She denies any exertional angina per se.  She has had no presyncope or syncope.  She has had no palpitations like before but does notice occasional extra heartbeats at times.  She does overall feel better after her ablation procedure from a palpitation standpoint however.  She has had no severe bleeding or bruising.  She had labs drawn by her PCP including a TSH, CBC, and CMP recently that were within normal limits.          Current Medications: Current Meds  Medication Sig   albuterol (VENTOLIN HFA) 108 (90 Base) MCG/ACT inhaler Inhale 2 puffs into Gwendolyn lungs every 6 (six) hours as needed for wheezing or shortness of breath.   cetirizine (ZYRTEC) 10 MG tablet Take 10 mg by mouth daily.  diazepam (VALIUM) 10 MG tablet Take 10 mg by mouth See admin instructions. Take 10 mg by mouth 3 times daily, may take a 4th 10 mg tablet as needed for SVT flare   [DISCONTINUED] bisoprolol (ZEBETA) 5 MG tablet Take 1 tablet (5 mg total) by mouth daily.   [DISCONTINUED] cyclobenzaprine (FLEXERIL) 5 MG tablet Take 5 mg by mouth 3 (three) times daily as needed for muscle spasms.   [DISCONTINUED] diltiazem (CARDIZEM) 30 MG tablet Take 30 mg by mouth 5 (five) times daily as needed (SVT flare).   [DISCONTINUED] ivabradine (CORLANOR) 7.5 MG TABS tablet Take 2 tablets (15 mg total) by mouth  once for 1 dose. Take 90-120 minutes prior to scan.   [DISCONTINUED] norethindrone (MICRONOR) 0.35 MG tablet Take 1 tablet by mouth daily.   [DISCONTINUED] omeprazole (PRILOSEC) 40 MG capsule Take 40 mg by mouth daily.   [DISCONTINUED] traMADol (ULTRAM) 50 MG tablet Take 50 mg by mouth every 6 (six) hours.     Review of Systems:   Please see Gwendolyn history of present illness.    All other systems reviewed and are negative.     EKGs/Labs/Other Test Reviewed:   EKG: EKG performed June 2023 that I reviewed demonstrates sinus rhythm  EKG Interpretation Date/Time:  Friday December 14 2022 15:27:42 EDT Ventricular Rate:  69 PR Interval:  124 QRS Duration:  86 QT Interval:  406 QTC Calculation: 435 R Axis:   59  Text Interpretation: Normal sinus rhythm Normal ECG When compared with ECG of 10-Aug-2021 12:28, No significant change was found Confirmed by Alverda Skeans ([id number]) on 12/14/2022 3:31:04 PM         Risk Assessment/Calculations:          Physical Exam:   VS:  BP 110/82   Pulse 72   Ht 5' (1.524 m)   Wt 140 lb 3.2 oz (63.6 kg)   LMP  (LMP Unknown)   SpO2 98%   BMI 27.38 kg/m        Wt Readings from Last 3 Encounters:  12/14/22 140 lb 3.2 oz (63.6 kg)  09/18/21 158 lb 6.4 oz (71.8 kg)  08/10/21 167 lb (75.8 kg)      GENERAL:  No apparent distress, AOx3 HEENT:  No carotid bruits, +2 carotid impulses, no scleral icterus CAR: RRR no murmurs, gallops, rubs, or thrills RES:  Clear to auscultation bilaterally ABD:  Soft, nontender, nondistended, positive bowel sounds x 4 VASC:  +2 radial pulses, +2 carotid pulses NEURO:  CN 2-12 grossly intact; motor and sensory grossly intact PSYCH:  No active depression or anxiety EXT:  No edema, ecchymosis, or cyanosis  Signed, Orbie Pyo, MD  12/14/2022 4:24 PM    [facility] [address] [place], [place], [place]  [address] Phone: [phone]; Fax: [phone]   Note:  This document was prepared using  Dragon voice recognition software and may include unintentional dictation errors.

## 2022-12-14 ENCOUNTER — Ambulatory Visit: Payer: Medicaid Other | Attending: Internal Medicine | Admitting: Internal Medicine

## 2022-12-14 ENCOUNTER — Other Ambulatory Visit: Payer: Self-pay | Admitting: *Deleted

## 2022-12-14 ENCOUNTER — Ambulatory Visit: Payer: Medicaid Other

## 2022-12-14 ENCOUNTER — Other Ambulatory Visit: Payer: Self-pay | Admitting: Internal Medicine

## 2022-12-14 ENCOUNTER — Encounter: Payer: Self-pay | Admitting: Internal Medicine

## 2022-12-14 ENCOUNTER — Other Ambulatory Visit (HOSPITAL_COMMUNITY): Payer: Self-pay

## 2022-12-14 VITALS — BP 110/82 | HR 72 | Ht 60.0 in | Wt 140.2 lb

## 2022-12-14 DIAGNOSIS — I471 Supraventricular tachycardia, unspecified: Secondary | ICD-10-CM

## 2022-12-14 DIAGNOSIS — R0602 Shortness of breath: Secondary | ICD-10-CM

## 2022-12-14 DIAGNOSIS — Z6831 Body mass index (BMI) 31.0-31.9, adult: Secondary | ICD-10-CM | POA: Diagnosis not present

## 2022-12-14 DIAGNOSIS — N182 Chronic kidney disease, stage 2 (mild): Secondary | ICD-10-CM

## 2022-12-14 DIAGNOSIS — Z9889 Other specified postprocedural states: Secondary | ICD-10-CM

## 2022-12-14 MED ORDER — IVABRADINE HCL 7.5 MG PO TABS
15.0000 mg | ORAL_TABLET | Freq: Once | ORAL | 0 refills | Status: DC
  Filled 2022-12-14: qty 2, 1d supply, fill #0

## 2022-12-14 NOTE — Patient Instructions (Addendum)
Medication Instructions:  No changes *If you need a refill on your cardiac medications before your next appointment, please call your pharmacy*   Lab Work: No labs needed, BMET on KPN is up to date   Testing/Procedures: Your physician has requested that you have an echocardiogram. Echocardiography is a painless test that uses sound waves to create images of your heart. It provides your doctor with information about the size and shape of your heart and how well your heart's chambers and valves are working. This procedure takes approximately one hour. There are no restrictions for this procedure. Please do NOT wear cologne, perfume, aftershave, or lotions (deodorant is allowed). Please arrive 15 minutes prior to your appointment time.  Coronary CT Angiogram - see instructions below  7 Day Zio Monitor - see instructions below (PATIENT REQUESTS IN OFFICE APPLICATION OF MONITOR)   Follow-Up: At [facility], you and your health needs are our priority.  As part of our continuing mission to provide you with exceptional heart care, we have created designated Provider Care Teams.  These Care Teams include your primary Cardiologist (physician) and Advanced Practice Providers (APPs -  Physician Assistants and Nurse Practitioners) who all work together to provide you with the care you need, when you need it.   Your next appointment:   3 month(s)  Provider:   Jari Favre, PA-C, Ronie Spies, PA-C, Robin Searing, NP, Jacolyn Reedy, PA-C, Eligha Bridegroom, NP, Tereso Newcomer, PA-C, or Perlie Gold, PA-C    Other Instructions: PER PATIENT HER RESTING HR IS IN THE 50S, NO HEART RATE LOWERING MEDICATION HAS BEEN PRESCRIBED   Your cardiac CT will be scheduled at  [facility] [address] [place], [place] [address] [phone]   Please arrive at the [facility] (Entrance C2) of [facility] 30 minutes prior to test start time. You can use the FREE  valet parking offered at entrance C (encouraged to control the heart rate for the test)  Proceed to the [facility] Radiology Department (first floor) to check-in and test prep.  All radiology patients and guests should use entrance C2 at [facility], accessed from [facility], even though the hospital's physical address listed is [address].     Please follow these instructions carefully (unless otherwise directed):  An IV will be required for this test and Nitroglycerin will be given.    On the Night Before the Test: Be sure to Drink plenty of water. Do not consume any caffeinated/decaffeinated beverages or chocolate 12 hours prior to your test. Do not take any antihistamines 12 hours prior to your test. (Cetirizine)  On the Day of the Test: Drink plenty of water until 1 hour prior to the test. Do not eat any food 1 hour prior to test. You may take your regular medications prior to the test.  FEMALES- please wear underwire-free bra if available, avoid dresses & tight clothing      After the Test: Drink plenty of water. After receiving IV contrast, you may experience a mild flushed feeling. This is normal. On occasion, you may experience a mild rash up to 24 hours after the test. This is not dangerous. If this occurs, you can take Benadryl 25 mg and increase your fluid intake. If you experience trouble breathing, this can be serious. If it is severe call 911 IMMEDIATELY. If it is mild, please call our office. If you take any of these medications: Glipizide/Metformin, Avandament, Glucavance, please  do not take 48 hours after completing test unless otherwise instructed.  We will call to schedule your test 2-4 weeks out understanding that some insurance companies will need an authorization prior to the service being performed.   For more information and frequently asked questions, please visit our website : [email or web address]  For  non-scheduling related questions, please contact the cardiac imaging nurse navigator should you have any questions/concerns: Cardiac Imaging Nurse Navigators Direct Office Dial: [phone]   For scheduling needs, including cancellations and rescheduling, please call [place], [phone].   ZIO XT- Long Term Monitor Instructions  Your physician has requested you wear a ZIO patch monitor for 7 days.  This is a single patch monitor. [employer] supplies one patch monitor per enrollment. Additional stickers are not available. Please do not apply patch if you will be having a Nuclear Stress Test,  Echocardiogram, Cardiac CT, MRI, or Chest Xray during the period you would be wearing the  monitor. The patch cannot be worn during these tests. You cannot remove and re-apply the  ZIO XT patch monitor.  Your ZIO patch monitor will be mailed 3 day USPS to your address on file. It may take 3-5 days  to receive your monitor after you have been enrolled.  Once you have received your monitor, please review the enclosed instructions. Your monitor  has already been registered assigning a specific monitor serial # to you.  Billing and Patient Assistance Program Information  We have supplied [employer] with any of your insurance information on file for billing purposes. [employer] offers a sliding scale Patient Assistance Program for patients that do not have  insurance, or whose insurance does not completely cover the cost of the ZIO monitor.  You must apply for the Patient Assistance Program to qualify for this discounted rate.  To apply, please call [employer] at [phone], select option 4, select option 2, ask to apply for  Patient Assistance Program. Meredeth Ide will ask your household income, and how many people  are in your household. They will quote your out-of-pocket cost based on that information.  [employer] will also be able to set up a 6-month, interest-free payment plan if needed.  Applying the  monitor  Hold abrader disc by orange tab. Rub abrader in 40 strokes over the upper left chest as  indicated in your monitor instructions.  Clean area with 4 enclosed alcohol pads. Let dry.  Apply patch as indicated in monitor instructions. Patch will be placed under collarbone on left  side of chest with arrow pointing upward.  Rub patch adhesive wings for 2 minutes. Remove white label marked "1". Remove the white  label marked "2". Rub patch adhesive wings for 2 additional minutes.  While looking in a mirror, press and release button in center of patch. A small green light will  flash 3-4 times. This will be your only indicator that the monitor has been turned on.  Do not shower for the first 24 hours. You may shower after the first 24 hours.  Press the button if you feel a symptom. You will hear a small click. Record Date, Time and  Symptom in the Patient Logbook.  When you are ready to remove the patch, follow instructions on the last 2 pages of Patient  Logbook. Stick patch monitor onto the last page of Patient Logbook.  Place Patient Logbook in the blue and white box. Use locking tab on box and tape box closed  securely. The blue and white box has prepaid postage on  it. Please place it in the mailbox as  soon as possible. Your physician should have your test results approximately 7 days after the  monitor has been mailed back to [facility]  Call [facility] Customer Care at [phone] if you have questions regarding  your ZIO XT patch monitor. Call them immediately if you see an orange light blinking on your  monitor.  If your monitor falls off in less than 4 days, contact our Monitor department at [phone].  If your monitor becomes loose or falls off after 4 days call [employer] at [phone] for  suggestions on securing your monitor

## 2022-12-14 NOTE — Progress Notes (Unsigned)
z

## 2022-12-19 ENCOUNTER — Telehealth: Payer: Self-pay | Admitting: *Deleted

## 2022-12-19 NOTE — Telephone Encounter (Signed)
Calling to schedule an appointment to apply your ZIO XT monitor ordered by Dr. Lynnette Caffey. Please call Gwendolyn [name] in monitors at [phone].

## 2023-01-14 ENCOUNTER — Ambulatory Visit (INDEPENDENT_AMBULATORY_CARE_PROVIDER_SITE_OTHER): Payer: Medicaid Other

## 2023-01-14 ENCOUNTER — Encounter: Payer: Self-pay | Admitting: Internal Medicine

## 2023-01-14 ENCOUNTER — Ambulatory Visit (HOSPITAL_COMMUNITY): Payer: Medicaid Other | Attending: Internal Medicine

## 2023-01-14 DIAGNOSIS — R0602 Shortness of breath: Secondary | ICD-10-CM | POA: Insufficient documentation

## 2023-01-14 DIAGNOSIS — Z9889 Other specified postprocedural states: Secondary | ICD-10-CM | POA: Diagnosis present

## 2023-01-14 DIAGNOSIS — I471 Supraventricular tachycardia, unspecified: Secondary | ICD-10-CM | POA: Diagnosis not present

## 2023-01-14 LAB — ECHOCARDIOGRAM COMPLETE
Area-P 1/2: 3.02 cm2
S' Lateral: 3.4 cm

## 2023-01-14 NOTE — Progress Notes (Unsigned)
ZIO XT serial # [id number] from office inventory applied to patient. Dr. Lynnette Caffey to read.

## 2023-01-15 NOTE — Telephone Encounter (Addendum)
Called patient and left detailed message (DPR) - confirmed pumping function is strong without indication of heart failure and valves are working well, no leaking of aortic valve was seen in this echo per Dr. Trula Ore review -   Your echo shows that the heart is strong and your valves are working well, good news.  Written by Orbie Pyo, MD on 01/14/2023  6:43 PM EST

## 2023-01-16 ENCOUNTER — Telehealth (HOSPITAL_COMMUNITY): Payer: Self-pay | Admitting: *Deleted

## 2023-01-16 ENCOUNTER — Other Ambulatory Visit (HOSPITAL_COMMUNITY): Payer: Self-pay

## 2023-01-16 ENCOUNTER — Other Ambulatory Visit (HOSPITAL_COMMUNITY): Payer: Self-pay | Admitting: *Deleted

## 2023-01-16 MED ORDER — IVABRADINE HCL 7.5 MG PO TABS
ORAL_TABLET | ORAL | 0 refills | Status: DC
  Filled 2023-01-16 – 2023-02-15 (×2): qty 1, 1d supply, fill #0

## 2023-01-16 NOTE — Telephone Encounter (Signed)
Calling patient about her upcoming cardiac CT. She has a cardiac monitor placed on her chest. She has been r/s to Dec. 9. Informed her that a single tablet of medicine for the test has been sent to the [facility]. She is aware to pick it up. (7.5mg  ivabradine sent)  Larey Brick RN Navigator Cardiac Imaging [facility] and Vascular Services [phone] Office [phone] Cell

## 2023-01-18 ENCOUNTER — Ambulatory Visit (HOSPITAL_COMMUNITY): Admission: RE | Admit: 2023-01-18 | Payer: Medicaid Other | Source: Ambulatory Visit

## 2023-01-26 ENCOUNTER — Other Ambulatory Visit (HOSPITAL_COMMUNITY): Payer: Self-pay

## 2023-02-01 ENCOUNTER — Telehealth (HOSPITAL_COMMUNITY): Payer: Self-pay | Admitting: *Deleted

## 2023-02-01 NOTE — Telephone Encounter (Signed)
Attempted to call patient regarding upcoming cardiac CT appointment. °Left message on voicemail with name and callback number ° °Edie Vallandingham RN Navigator Cardiac Imaging °[facility] °[phone] Office °[phone] Cell ° °

## 2023-02-04 ENCOUNTER — Ambulatory Visit (HOSPITAL_COMMUNITY): Admission: RE | Admit: 2023-02-04 | Payer: Medicaid Other | Source: Ambulatory Visit

## 2023-02-15 ENCOUNTER — Other Ambulatory Visit: Payer: Self-pay

## 2023-02-15 ENCOUNTER — Telehealth (HOSPITAL_COMMUNITY): Payer: Self-pay | Admitting: *Deleted

## 2023-02-15 ENCOUNTER — Telehealth: Payer: Self-pay | Admitting: Cardiology

## 2023-02-15 ENCOUNTER — Encounter (HOSPITAL_COMMUNITY): Payer: Self-pay

## 2023-02-15 ENCOUNTER — Other Ambulatory Visit (HOSPITAL_COMMUNITY): Payer: Self-pay

## 2023-02-15 NOTE — Telephone Encounter (Signed)
Attempted to call patient regarding upcoming cardiac CT appointment. °Left message on voicemail with name and callback number ° °Edie Vallandingham RN Navigator Cardiac Imaging °[facility] °[phone] Office °[phone] Cell ° °

## 2023-02-15 NOTE — Telephone Encounter (Signed)
Pt would like to know if she has to take the pill for her procedure or if she can have it done without it. Please advise

## 2023-02-15 NOTE — Telephone Encounter (Signed)
Spoke w patient.  She is afraid to take the corlanor for the cCTA.  Always has problems with cardiac medicines.  Reports resting heart rate is in 60s, sometimes going down to 50s.  Adv her to bring the Corlanor with her and that she may be given a different medication or asked to take that one depending on her heart rate while she is there.  Pt voices understanding and agreement.

## 2023-02-15 NOTE — Telephone Encounter (Signed)
Reaching out to patient to offer assistance regarding upcoming cardiac imaging study; pt verbalizes understanding of appt date/time, parking situation and where to check in, pre-test NPO status and medications ordered, and verified current allergies; name and call back number provided for further questions should they arise  Larey Brick RN Navigator Cardiac Imaging [facility] [phone] office [phone] cell  Patient to take 7.5mg  ivabradine two hours prior to his cardiac CT scan. She is aware to arrive 12 PM.

## 2023-02-18 ENCOUNTER — Ambulatory Visit (HOSPITAL_COMMUNITY): Admission: RE | Admit: 2023-02-18 | Payer: Medicaid Other | Source: Ambulatory Visit

## 2023-03-11 ENCOUNTER — Other Ambulatory Visit (HOSPITAL_COMMUNITY): Payer: Self-pay

## 2023-03-18 ENCOUNTER — Ambulatory Visit: Payer: Medicaid Other | Attending: Physician Assistant | Admitting: Physician Assistant

## 2023-03-18 NOTE — Progress Notes (Deleted)
  Cardiology Office Note:  .   Date:  03/18/2023  ID:  Gwendolyn [name], DOB 26-Feb-1981, MRN [id number] PCP: Richardean Chimera, MD  [facility] Providers Cardiologist:  Will Jorja Loa, MD Electrophysiologist:  Will Jorja Loa, MD {  History of Present Illness: .   Gwendolyn [name] is a 43 y.o. female with a past medical history of shortness of breath, SVT, CKD, and elevated BMI here for follow-up appointment.  She was seen by Dr. Lynnette Caffey back in October and at that time a 14-day ZIO monitor was obtained status post ablation of her SVT.  Otherwise, doing well from a cardiovascular standpoint.  Coronary CTA was ordered to further monitor dyspnea. She returns to review results.  Today, she ***  ROS: ***  Studies Reviewed: .        *** Risk Assessment/Calculations:   {Does this patient have ATRIAL FIBRILLATION?:[phone]} No BP recorded.  {Refresh Note OR Click here to enter BP  :1}***       Physical Exam:   VS:  LMP  (LMP Unknown)    Wt Readings from Last 3 Encounters:  12/14/22 140 lb 3.2 oz (63.6 kg)  09/18/21 158 lb 6.4 oz (71.8 kg)  08/10/21 167 lb (75.8 kg)    GEN: Well nourished, well developed in no acute distress NECK: No JVD; No carotid bruits CARDIAC: ***RRR, no murmurs, rubs, gallops RESPIRATORY:  Clear to auscultation without rales, wheezing or rhonchi  ABDOMEN: Soft, non-tender, non-distended EXTREMITIES:  No edema; No deformity   ASSESSMENT AND PLAN: .   Dyspnea SVT CKD stage II Elevated BMI    {Are you ordering a CV Procedure (e.g. stress test, cath, DCCV, TEE, etc)?   Press F2        :[id number]}  Dispo: ***  Signed, Sharlene Dory, PA-C

## 2023-03-19 ENCOUNTER — Encounter: Payer: Self-pay | Admitting: Physician Assistant

## 2023-03-21 ENCOUNTER — Encounter (HOSPITAL_COMMUNITY): Payer: Self-pay

## 2023-03-22 ENCOUNTER — Telehealth (HOSPITAL_COMMUNITY): Payer: Self-pay | Admitting: Emergency Medicine

## 2023-03-22 NOTE — Telephone Encounter (Signed)
Attempted to call patient regarding upcoming cardiac CT appointment. Left message on voicemail with name and callback number Rockwell Alexandria RN Navigator Cardiac Imaging [facility] Vascular Services [phone] Office [phone] Cell

## 2023-03-25 ENCOUNTER — Other Ambulatory Visit (HOSPITAL_COMMUNITY): Payer: Medicaid Other

## 2023-03-25 ENCOUNTER — Ambulatory Visit (HOSPITAL_COMMUNITY): Admission: RE | Admit: 2023-03-25 | Payer: Medicaid Other | Source: Ambulatory Visit

## 2023-12-05 NOTE — ED Provider Notes (Signed)
 ------------------------------------------------------------------------------- Attestation signed by Aloha Theo Furth, MD at 12/11/23 [phone] Patient encounter was completed by the APP listed on this note. I was not directly involved in the care of this patient; however, I was present in the department and available to offer assistance at any moment if requested. -------------------------------------------------------------------------------   [facility]  ED Provider Note  Gwendolyn [name] 43 y.o. female DOB: 1980-03-27 MRN: [id number] History   Chief Complaint  Patient presents with   Cough    Pt reports productive cough and right rib pain   HPI Patient with past medical history of depression, bipolar disorder, CAD presents to the ED for right rib pain.  Patient stated that she has also had a cough but this pain.  Patient denies any fever.  Patient does smoke.  Patient describes the pain as positional.  She states that she notices the pain when she is moving or coughs.  Patient stated that she has a chronic history of productive cough and has not changed.  Patient denies any chest pain, shortness of breath, fever, sore throat, neck stiffness, trauma.    Past Medical History:  Diagnosis Date   Anxiety    Bipolar 1 disorder (*)    Bradycardia    Coronary artery disease    Depression    GERD (gastroesophageal reflux disease)    Migraine    Nonpsychotic mental disorder    SVT (supraventricular tachycardia) (*)     Past Surgical History:  Procedure Laterality Date   Cholecystectomy     Partial hysterectomy      Social History   Substance and Sexual Activity  Alcohol Use Never   Tobacco Use History[1] E-Cigarettes   Vaping Use Never User    Start Date     Cartridges/Day     Quit Date     Social History   Substance and Sexual Activity  Drug Use Never         Allergies[2]  Home Medications   BINAXNOW COVID-19  AG HOME TEST KIT    TEST AS DIRECTED TODAY   BISOPROLOL  (ZEBETA ) 5 MG TABLET    Take one tablet (5 mg dose) by mouth daily.   CETIRIZINE (ZYRTEC) 10 MG TABLET    Take one tablet (10 mg dose) by mouth daily.   CYCLOBENZAPRINE (FLEXERIL) 5 MG TABLET    Take one tablet (5 mg dose) by mouth.   DIAZEPAM  (VALIUM ) 10 MG TABLET    Take one tablet (10 mg dose) by mouth 3 (three) times a day as needed.   DILTIAZEM  (CARDIZEM ) 30 MG TABLET    Take one tablet (30 mg dose) by mouth.   DILTIAZEM  (TIAZAC ) 180 MG 24 HR CAPSULE    Take one capsule (180 mg dose) by mouth daily.   DOXYCYCLINE HYCLATE (VIBRA-TABS) 100 MG TABLET    Take one tablet (100 mg dose) by mouth 2 (two) times daily.   FUROSEMIDE (LASIX) 20 MG TABLET    Take one tablet (20 mg dose) by mouth 2 (two) times daily.   IBUPROFEN  (ADVIL ,MOTRIN ) 800 MG TABLET    Take one tablet (800 mg dose) by mouth 3 (three) times a day.   METHOCARBAMOL (ROBAXIN) 500 MG TABLET    Take one tablet (500 mg dose) by mouth 3 (three) times a day as needed.   MONTELUKAST (SINGULAIR) 10 MG TABLET    SMARTSIG:1 Tablet(s) By Mouth Every Evening   NORETHINDRONE (ORTHO MICRONOR,CAMILA,NORA-BE,ERRIN,JOLIVETTE,Zaniyah,SHAROBEL) 0.35 MG TABLET    Take one tablet (  0.35 mg dose) by mouth daily.   OMEPRAZOLE  (PRILOSEC) 40 MG CAPSULE    Take one capsule (40 mg dose) by mouth 2 (two) times daily.   ONDANSETRON  (ZOFRAN -ODT) 4 MG DISINTEGRATING TABLET    Take one tablet (4 mg dose) by mouth every 6 (six) hours as needed.   POLYETHYLENE GLYCOL (MIRALAX,GAVILAX,CLEARLAX) 17 GM/SCOOP POWDER    Take seventeen g by mouth daily.   PREDNISONE 10 MG (21) TBPK    see administration instructions.   TRAMADOL (ULTRAM) 50 MG TABLET    Take one tablet (50 mg dose) by mouth every 6 (six) hours.   VENTOLIN HFA 108 (90 BASE) MCG/ACT INHALER    SMARTSIG:2 Puff(s) By Mouth Every 4-6 Hours PRN    Primary Survey  Primary Survey  Review of Systems   Review of Systems  All relevant ROS reviewed as seen  in HPI above.  Physical Exam   ED Triage Vitals [12/05/23 1441]  BP 119/74  Heart Rate 98  Resp 17  SpO2 98 %  Temp 98.5 F (36.9 C)    Physical Exam  Patient is Well-appearing, in no acute distress, and appears stated age.  Vitals within normal limits.  Patient's heart sounds are normal, normal rate and rhythm, no peripheral edema noted, intact pulses.   Breath sounds normal lungs were clear to auscultation bilaterally.  Patient did have some tenderness mid axillary right side of ribs most prominently and the intercostals.     ED Course   Lab results: No data to display  Imaging:   XR RIBS RIGHT W PA CHEST   Narrative:    EXAM: Chest PA and right ribs  INDICATION:  Chest Pain  COMPARISON:  Jul 01, 2022  TECHNIQUE: XR RIBS RIGHT W PA CHEST  FINDINGS: Lungs: No pneumothorax, pleural effusion or focal consolidation. Heart: Normal heart size. Mediastinal and hilar structures: Normal contours.  Bony structures: No significant abnormality. Specifically, no evidence of displaced rib fracture.     Impression:    IMPRESSION: No evidence of acute intrathoracic process. Specifically, no evidence of displaced rib fracture.  Electronically Signed by: Rilla Pickett on 12/05/2023 3:56 PM     ECG: ECG Results   None                                                                        Pre-Sedation Procedures    Medical Decision Making Patient presents for right sided rib pain.  Differential includes but not limited to the following: COPD exacerbation, asthma, trauma, pneumothorax, pneumonia, MI, costochondritis, musculoskeletal strain.  X-ray showed no acute abnormalities.  Patient was normotensive, satting at 99 on room air, had a normal heart rate.  Patient's pain was isolated to mid axillary right rib pain that was tender to palpation.  She was having no active chest pain or shortness of breath.  Due to these  factors, history physical and overall clinical picture I think the patient was stable for discharge.  I recommended using OTC ibuprofen  and alternate with Tylenol  for symptomatic relief.  I gave the patient strict return to ED protocol and will have them follow-up with her PCP as needed.          Provider Communication  New Prescriptions  No medications on file    Modified Medications   No medications on file    Discontinued Medications   No medications on file    Clinical Impression Final diagnoses:  Costochondritis    ED Disposition     ED Disposition  Discharge   Condition  Stable   Comment  --                 Follow-up Information     Jerel KANDICE Sieving, MD.   Specialty: Family Medicine Contact information: [address] [place] [address] [phone]                  Electronically signed by:         [1] Social History Tobacco Use  Smoking Status Every Day   Current packs/day: 1.00   Types: Cigarettes  Smokeless Tobacco Never  [2] Allergies Allergen Reactions   Amoxicillin Swelling and Rash   Azithromycin Other    SVT    Norman Osgood, PA-C 12/05/23 1925

## 2024-02-14 ENCOUNTER — Other Ambulatory Visit: Payer: Self-pay

## 2024-02-14 ENCOUNTER — Telehealth: Payer: Self-pay | Admitting: Cardiology

## 2024-02-14 DIAGNOSIS — R072 Precordial pain: Secondary | ICD-10-CM

## 2024-02-14 MED ORDER — IVABRADINE HCL 7.5 MG PO TABS: 15.0000 mg | ORAL_TABLET | Freq: Once | ORAL | 0 refills | Status: AC

## 2024-02-14 NOTE — Telephone Encounter (Signed)
 Spoke with pt on the phone. States she went to the ED yesterday with chest pain. States the chest pain is always when she is washing dishes or doing anything around the house. She said she read over her EKG results and was concerned that it said she had an infarct. Pt said that ED dr said to speak to cardiologist and see if she needs an Echo done. See EKG below as I am unable to see it in results tab:  ECG Diagnosis Normal sinus rhythm Possible Anterior infarct (cited on or before 13-Feb-2024) Nonspecific T wave abnormality When compared with ECG of 13-Feb-2024 18:32, No significant change was found   Will forward this along to Dr Wendel for review. Went over ER protocol with patient and she verbalizes understanding.

## 2024-02-14 NOTE — Telephone Encounter (Signed)
 Called and spoke to pt's husband. CCTA scheduled for 03/02/24 at [facility] location in [place]. Ivabradine  sent (by Powell, Triage RN) to [facility].

## 2024-02-14 NOTE — Telephone Encounter (Signed)
 Spoke with patient in regards to ED visit. [facility]. Pt states she has tightness with any activity (washing dishes, moping, sweeping), can't breathe and gets sweaty. Labs, EKG and CT was done. Pt has appt made for 03/13/24. Will send to Dr. Inocencio and Dr. Wendel for any further recommendations. Pt verbalizes understanding of plan and advised ED precautions if chest tightness or shortness of breath worsened.

## 2024-02-14 NOTE — Progress Notes (Signed)
 Instructions sent to MyChart. Spoke with husband and requesting medication sent to [facility]. Dr Wendel nurse aware and placed order for cardiac CTA and medication. Scheduler contacting patient to get this set up.

## 2024-02-14 NOTE — Telephone Encounter (Signed)
 "  Your cardiac CT will be scheduled at one of the below locations:   [facility] [address] [place], [place] [address] [phone] (Severe contrast allergies only)  OR   [facility] [address] [place], [place] [address] [phone]  OR   [facility] [address] [place], [place] [address] [phone]  OR   [facility] and Vascular Tower [address]  [place], [place] [address]  OR   [facility] [address] [place], [place] [phone]  If scheduled at [facility], please arrive at the [facility] and Children[facility] Entrance (Entrance C2) of [facility] 30 minutes prior to test start time. You can use the FREE valet parking offered at entrance C (encouraged to control the heart rate for the test)  Proceed to the [facility] Radiology Department (first floor) to check-in and test prep.  All radiology patients and guests should use entrance C2 at [facility], accessed from [facility], even though the hospital's physical address listed is [address].  If scheduled at the Heart and Vascular Tower at [address], please enter the parking lot using the [address] entrance and use the FREE valet service at the patient drop-off area. Enter the building and check-in with registration on the main floor.  If scheduled at [facility], please arrive to the Heart and Vascular Center 15 mins early for check-in and test prep.  There is spacious parking and easy access to the radiology department from the [facility] Heart and Vascular entrance. Please enter here and check-in with the desk attendant.   If scheduled at [facility], please arrive 30 minutes early for check-in and test prep.  Please follow these instructions carefully (unless otherwise directed):  An IV will be required for this test and Nitroglycerin will be  given.  Hold all erectile dysfunction medications at least 3 days (72 hrs) prior to test. (Ie viagra, cialis, sildenafil, tadalafil, etc)   On the Night Before the Test: Be sure to Drink plenty of water. Do not consume any caffeinated/decaffeinated beverages or chocolate 12 hours prior to your test. Do not take any antihistamines 12 hours prior to your test.  If the patient has contrast allergy: Patient will need a prescription for Prednisone and very clear instructions (as follows): Prednisone 50 mg - take 13 hours prior to test Take another Prednisone 50 mg 7 hours prior to test Take another Prednisone 50 mg 1 hour prior to test Take Benadryl 50 mg 1 hour prior to test Patient must complete all four doses of above prophylactic medications. Patient will need a ride after test due to Benadryl.  On the Day of the Test: Drink plenty of water until 1 hour prior to the test. Do not eat any food 1 hour prior to test. You may take your regular medications prior to the test.  Take metoprolol  (Lopressor ) two hours prior to test. If you take Furosemide/Hydrochlorothiazide/Spironolactone/Chlorthalidone, please HOLD on the morning of the test. Patients who wear a continuous glucose monitor MUST remove the device prior to scanning. FEMALES- please wear underwire-free bra if available, avoid dresses & tight clothing  *For Clinical Staff only. Please instruct patient the following:* Heart Rate Medication Recommendations for Cardiac CT  Resting HR < 50 bpm  No medication  Resting HR 50-60 bpm and BP >110/50 mmHG   Consider Metoprolol  tartrate 25 mg PO 90-120 min prior  to scan  Resting HR 60-65 bpm and BP >110/50 mmHG  Metoprolol  tartrate 50 mg PO 90-120 minutes prior to scan   Resting HR > 65 bpm and BP >110/50 mmHG  Metoprolol  tartrate 100 mg PO 90-120 minutes prior to scan  Consider Ivabradine  10-15 mg PO or a calcium channel blocker for resting HR >60 bpm and contraindication to metoprolol   tartrate  Consider Ivabradine  10-15 mg PO in combination with metoprolol  tartrate for HR >80 bpm        After the Test: Drink plenty of water. After receiving IV contrast, you may experience a mild flushed feeling. This is normal. On occasion, you may experience a mild rash up to 24 hours after the test. This is not dangerous. If this occurs, you can take Benadryl 25 mg, Zyrtec, Claritin, or Allegra and increase your fluid intake. (Patients taking Tikosyn should avoid Benadryl, and may take Zyrtec, Claritin, or Allegra) If you experience trouble breathing, this can be serious. If it is severe call 911 IMMEDIATELY. If it is mild, please call our office.  We will call to schedule your test 2-4 weeks out understanding that some insurance companies will need an authorization prior to the service being performed.   For more information and frequently asked questions, please visit our website : [email or web address]  For non-scheduling related questions, please contact the cardiac imaging nurse navigator should you have any questions/concerns: Cardiac Imaging Nurse Navigators Direct Office Dial: [phone]   For scheduling needs, including cancellations and rescheduling, please call Brittany, [phone].  "

## 2024-02-14 NOTE — Telephone Encounter (Signed)
 Patient stated she is concerned regarding EKG results from her ED visit yesterday.

## 2024-02-25 NOTE — Telephone Encounter (Signed)
 Patient identification verified by 2 forms. Spoke with pt regarding her upcoming coronary CTA. Pt states that instructions were given to her husband yesterday and he has medical conditions (brain tumor/short term memory loss) that make it hard for him to remember all the information that was provided. Advised pt in the future to consider removing her husband from [facility] to prevent confusion in the future. Reviewed CCTA instructions with pt.  Pt is not comfortable with taking Ivabradine  15mg  due to sometimes being bradycardic. Pt states she heart rates are typically in the 60s. We will hold off on having her take medication prior to CCTA. New location discussed with pt. All questions answered. Pt verbalizes understanding.

## 2024-03-02 ENCOUNTER — Ambulatory Visit (HOSPITAL_COMMUNITY)

## 2024-03-09 ENCOUNTER — Ambulatory Visit (HOSPITAL_COMMUNITY)

## 2024-03-11 NOTE — Progress Notes (Unsigned)
 " Cardiology Office Note:    Date:  03/11/2024   ID:  Gwendolyn [name], DOB 1980/12/28, MRN [id number]  PCP:  Toribio Jerel MATSU, MD   [facility] Providers Cardiologist:  Will Gladis Norton, MD Electrophysiologist:  Will Gladis Norton, MD { Click to update primary MD,subspecialty MD or APP then REFRESH:1}    Referring MD: Toribio Jerel MATSU, MD   Chief complaint: Follow-up ED visit     History of Present Illness:   Gwendolyn [name] is a 44 y.o. female with a hx of  bipolar disorder, GERD, migraines and SVT who is here today for follow-up of recent ED visit for chest pain.  She is followed in our office by Dr. Antonetta for SVT.  2 weeks ZIO monitor February 2023 showing sinus rhythm, less than 1% ectopy, triggered events were both sinus rhythm and sinus tachycardia versus atrial tachycardia.  Repeat monitor in April 2023 showing sinus rhythm and <1% ventricular and supraventricular ectopy burden, no triggered events, no A-fib observed.  Patient had SVT ablation 08/10/2021, demonstrating classic AV nodal reentry tachycardia, with no inducible arrhythmias following ablation.  Most recent echo 01/14/2023 showing LVEF 60-65%, normal LV function, no RWMA, normal diastolic parameters, normal strain, no significant valvular abnormality.  Most recent cardiac event monitor 02/15/2023, worn for a week, HR 46-1 71, average 80, underlying sinus, minimal ectopy, no significant arrhythmias.   Patient was seen in the ED at [facility] on 02/13/2024 with complaints of chest pain, lightheadedness, dizziness with activity X 1 week.  Labs are unremarkable, EKG demonstrating NSR, no ST-T wave abnormalities, negative CXR, CT PE negative, patient discharged to follow-up with cardiology.  Patient notified her primary cardiologist of her recent ED visit via telephone, who ordered coronary CTA to rule out obstructive disease.  Not yet performed, placed on my schedule for follow-up.  Chest pain  SVT  ROS:   Please see  the history of present illness.    *** All other systems reviewed and are negative.     Past Medical History:  Diagnosis Date   Bipolar disorder (HCC)    Depression    GERD (gastroesophageal reflux disease)    Migraines    Palpitations    SVT (supraventricular tachycardia)    short RP SVT documented    Past Surgical History:  Procedure Laterality Date   ABDOMINAL HYSTERECTOMY     CHOLECYSTECTOMY     SVT ABLATION N/A 08/10/2021   Procedure: SVT ABLATION;  Surgeon: Norton Soyla Gladis, MD;  Location: [facility] INVASIVE CV LAB;  Service: Cardiovascular;  Laterality: N/A;   TUBAL LIGATION      Current Medications: Active Medications[1]   Allergies:   Amoxicillin, Toprol  xl [metoprolol ], Azithromycin, and Keflex [cephalexin]   Social History   Socioeconomic History   Marital status: Married    Spouse name: Not on file   Number of children: Not on file   Years of education: Not on file   Highest education level: Not on file  Occupational History   Occupation: Disabled  Tobacco Use   Smoking status: Every Day    Current packs/day: 0.00    Types: Cigarettes    Start date: 05/25/2010    Last attempt to quit: 05/24/2020    Years since quitting: 3.8   Smokeless tobacco: Never  Vaping Use   Vaping status: Never Used  Substance and Sexual Activity   Alcohol use: Not Currently    Alcohol/week: 0.0 standard drinks of alcohol   Drug use: Not Currently  Types: Marijuana    Comment: previous history of marijuana use   Sexual activity: Yes    Birth control/protection: Surgical    Comment: hyst  Other Topics Concern   Not on file  Social History Narrative   Married   No regular exercise   Social Drivers of Health   Tobacco Use: High Risk (12/05/2023)   Received from [facility]   Patient History    Smoking Tobacco Use: Every Day    Smokeless Tobacco Use: Never    Passive Exposure: Not on file  Financial Resource Strain: Not on file  Food Insecurity: Not on file   Transportation Needs: Not on file  Physical Activity: Not on file  Stress: Not on file  Social Connections: Not on file  Depression (EYV7-0): Not on file  Alcohol Screen: Not on file  Housing: Not on file  Utilities: Not on file  Health Literacy: Not on file     Family History: [name] ***family history includes ADD / ADHD in her daughter, son, and son; Esophageal cancer in her maternal uncle; Heart disease in her father and another family member; Liver disease in her maternal grandfather; Other in her son. There is no history of Colon cancer, Pancreatic cancer, or Stomach cancer.  EKGs/Labs/Other Studies Reviewed:    The following studies were reviewed today: ***      Recent Labs: No results found for requested labs within last 365 days.  Recent Lipid Panel No results found for: CHOL, TRIG, HDL, CHOLHDL, VLDL, LDLCALC, LDLDIRECT   Risk Assessment/Calculations:   {Does this patient have ATRIAL FIBRILLATION?:[phone]}  No BP recorded.  {Refresh Note OR Click here to enter BP  :1}***         Physical Exam:    VS:  LMP  (LMP Unknown)        Wt Readings from Last 3 Encounters:  12/14/22 140 lb 3.2 oz (63.6 kg)  09/18/21 158 lb 6.4 oz (71.8 kg)  08/10/21 167 lb (75.8 kg)     GEN: *** Well nourished, well developed in no acute distress HEENT: Normal NECK: No JVD; No carotid bruits CARDIAC: *** S1-S2 normal, RRR, no murmurs, rubs, gallops RESPIRATORY:  Clear to auscultation without rales, wheezing or rhonchi  MUSCULOSKELETAL:  No edema; No deformity  SKIN: Warm and dry NEUROLOGIC:  Alert and oriented x 3 PSYCHIATRIC:  Normal affect       Assessment & Plan   Disposition: *** Route to primary cardiologist      {Are you ordering a CV Procedure (e.g. stress test, cath, DCCV, TEE, etc)?   Press F2        :[id number]}    Medication Adjustments/Labs and Tests Ordered: Current medicines are reviewed at length with the patient today.   Concerns regarding medicines are outlined above.  No orders of the defined types were placed in this encounter.  No orders of the defined types were placed in this encounter.   There are no Patient Instructions on file for this visit.   Signed, Torria Fromer E Chantalle Defilippo, NP  03/11/2024 10:24 PM    [facility]    [1]  No outpatient medications have been marked as taking for the 03/13/24 encounter (Appointment) with Beata Beason E, NP.   "

## 2024-03-13 ENCOUNTER — Ambulatory Visit: Attending: Emergency Medicine | Admitting: Emergency Medicine

## 2024-03-17 ENCOUNTER — Ambulatory Visit (HOSPITAL_COMMUNITY)
Admission: RE | Admit: 2024-03-17 | Discharge: 2024-03-17 | Disposition: A | Discharge status: Home / Self Care | Source: Ambulatory Visit | Attending: Internal Medicine | Admitting: Internal Medicine

## 2024-03-17 DIAGNOSIS — R072 Precordial pain: Secondary | ICD-10-CM | POA: Insufficient documentation

## 2024-03-17 MED ORDER — NITROGLYCERIN 0.4 MG SL SUBL
0.8000 mg | SUBLINGUAL_TABLET | Freq: Once | SUBLINGUAL | Status: AC
  Administered 2024-03-17: 0.8 mg via SUBLINGUAL

## 2024-03-17 MED ORDER — IOHEXOL 350 MG/ML SOLN
100.0000 mL | Freq: Once | INTRAVENOUS | Status: AC | PRN
  Administered 2024-03-17: 100 mL via INTRAVENOUS

## 2024-03-18 ENCOUNTER — Ambulatory Visit: Payer: Self-pay | Admitting: Internal Medicine

## 2024-04-20 ENCOUNTER — Ambulatory Visit: Admitting: Cardiology
